# Patient Record
Sex: Female | Born: 1960 | Race: Black or African American | Hispanic: No | Smoking: Never smoker
Health system: Southern US, Community
[De-identification: ages and names within clinical notes are randomized; demographics above are authoritative.]

## PROBLEM LIST (undated history)

## (undated) DIAGNOSIS — E041 Nontoxic single thyroid nodule: Secondary | ICD-10-CM

## (undated) DIAGNOSIS — E785 Hyperlipidemia, unspecified: Secondary | ICD-10-CM

## (undated) DIAGNOSIS — K219 Gastro-esophageal reflux disease without esophagitis: Secondary | ICD-10-CM

## (undated) DIAGNOSIS — R51 Headache: Secondary | ICD-10-CM

## (undated) DIAGNOSIS — D649 Anemia, unspecified: Secondary | ICD-10-CM

## (undated) DIAGNOSIS — I10 Essential (primary) hypertension: Secondary | ICD-10-CM

## (undated) DIAGNOSIS — J309 Allergic rhinitis, unspecified: Secondary | ICD-10-CM

## (undated) DIAGNOSIS — D179 Benign lipomatous neoplasm, unspecified: Secondary | ICD-10-CM

## (undated) HISTORY — DX: Nontoxic single thyroid nodule: E04.1

## (undated) HISTORY — DX: Allergic rhinitis, unspecified: J30.9

## (undated) HISTORY — DX: Gastro-esophageal reflux disease without esophagitis: K21.9

## (undated) HISTORY — DX: Anemia, unspecified: D64.9

## (undated) HISTORY — DX: Benign lipomatous neoplasm, unspecified: D17.9

## (undated) HISTORY — DX: Headache: R51

## (undated) HISTORY — DX: Hyperlipidemia, unspecified: E78.5

## (undated) HISTORY — DX: Essential (primary) hypertension: I10

---

## 2005-11-07 HISTORY — PX: ABDOMINAL HYSTERECTOMY: SHX81

## 2006-01-26 ENCOUNTER — Emergency Department (HOSPITAL_COMMUNITY): Admission: EM | Admit: 2006-01-26 | Discharge: 2006-01-26 | Payer: Self-pay | Admitting: Family Medicine

## 2006-01-26 ENCOUNTER — Ambulatory Visit (HOSPITAL_COMMUNITY): Admission: RE | Admit: 2006-01-26 | Discharge: 2006-01-26 | Payer: Self-pay | Admitting: Family Medicine

## 2006-04-26 ENCOUNTER — Ambulatory Visit: Payer: Self-pay | Admitting: Nurse Practitioner

## 2006-04-26 ENCOUNTER — Ambulatory Visit: Payer: Self-pay | Admitting: *Deleted

## 2006-10-03 ENCOUNTER — Emergency Department (HOSPITAL_COMMUNITY): Admission: EM | Admit: 2006-10-03 | Discharge: 2006-10-03 | Payer: Self-pay | Admitting: Family Medicine

## 2006-12-04 ENCOUNTER — Emergency Department (HOSPITAL_COMMUNITY): Admission: EM | Admit: 2006-12-04 | Discharge: 2006-12-04 | Payer: Self-pay | Admitting: Emergency Medicine

## 2006-12-12 ENCOUNTER — Emergency Department (HOSPITAL_COMMUNITY): Admission: EM | Admit: 2006-12-12 | Discharge: 2006-12-12 | Payer: Self-pay | Admitting: Family Medicine

## 2007-02-28 ENCOUNTER — Inpatient Hospital Stay (HOSPITAL_COMMUNITY): Admission: EM | Admit: 2007-02-28 | Discharge: 2007-03-01 | Payer: Self-pay | Admitting: Emergency Medicine

## 2007-05-16 ENCOUNTER — Ambulatory Visit: Payer: Self-pay | Admitting: Obstetrics & Gynecology

## 2007-05-16 ENCOUNTER — Encounter: Payer: Self-pay | Admitting: Obstetrics & Gynecology

## 2007-05-16 ENCOUNTER — Other Ambulatory Visit: Admission: RE | Admit: 2007-05-16 | Discharge: 2007-05-16 | Payer: Self-pay | Admitting: Obstetrics and Gynecology

## 2007-05-16 ENCOUNTER — Encounter: Payer: Self-pay | Admitting: Obstetrics and Gynecology

## 2007-06-08 ENCOUNTER — Ambulatory Visit: Payer: Self-pay | Admitting: Gynecology

## 2007-06-19 ENCOUNTER — Ambulatory Visit: Payer: Self-pay | Admitting: Family Medicine

## 2007-06-25 ENCOUNTER — Ambulatory Visit: Payer: Self-pay | Admitting: Gynecology

## 2007-06-25 ENCOUNTER — Encounter (INDEPENDENT_AMBULATORY_CARE_PROVIDER_SITE_OTHER): Payer: Self-pay | Admitting: Gynecology

## 2007-06-25 ENCOUNTER — Ambulatory Visit (HOSPITAL_COMMUNITY): Admission: RE | Admit: 2007-06-25 | Discharge: 2007-06-26 | Payer: Self-pay | Admitting: Gynecology

## 2007-06-27 ENCOUNTER — Inpatient Hospital Stay (HOSPITAL_COMMUNITY): Admission: AD | Admit: 2007-06-27 | Discharge: 2007-07-03 | Payer: Self-pay | Admitting: Obstetrics & Gynecology

## 2007-06-29 ENCOUNTER — Ambulatory Visit: Payer: Self-pay | Admitting: Infectious Diseases

## 2007-07-18 ENCOUNTER — Ambulatory Visit: Payer: Self-pay | Admitting: Gynecology

## 2007-07-25 ENCOUNTER — Encounter (INDEPENDENT_AMBULATORY_CARE_PROVIDER_SITE_OTHER): Payer: Self-pay | Admitting: *Deleted

## 2007-08-07 ENCOUNTER — Encounter (INDEPENDENT_AMBULATORY_CARE_PROVIDER_SITE_OTHER): Payer: Self-pay | Admitting: Nurse Practitioner

## 2007-08-07 ENCOUNTER — Ambulatory Visit: Payer: Self-pay | Admitting: Internal Medicine

## 2007-08-07 LAB — CONVERTED CEMR LAB
ALT: 23 units/L (ref 0–35)
AST: 22 units/L (ref 0–37)
Basophils Absolute: 0 10*3/uL (ref 0.0–0.1)
Basophils Relative: 1 % (ref 0–1)
Chloride: 107 meq/L (ref 96–112)
Creatinine, Ser: 0.96 mg/dL (ref 0.40–1.20)
Eosinophils Relative: 4 % (ref 0–5)
Hemoglobin: 12.8 g/dL (ref 12.0–15.0)
MCHC: 33 g/dL (ref 30.0–36.0)
Monocytes Absolute: 0.3 10*3/uL (ref 0.2–0.7)
Neutro Abs: 2 10*3/uL (ref 1.7–7.7)
Platelets: 232 10*3/uL (ref 150–400)
RDW: 16.6 % — ABNORMAL HIGH (ref 11.5–14.0)
TSH: 2.034 microintl units/mL (ref 0.350–5.50)
Total Bilirubin: 0.5 mg/dL (ref 0.3–1.2)
Total CHOL/HDL Ratio: 4
VLDL: 45 mg/dL — ABNORMAL HIGH (ref 0–40)

## 2007-08-10 ENCOUNTER — Ambulatory Visit (HOSPITAL_COMMUNITY): Admission: RE | Admit: 2007-08-10 | Discharge: 2007-08-10 | Payer: Self-pay | Admitting: Nurse Practitioner

## 2007-08-10 ENCOUNTER — Ambulatory Visit (HOSPITAL_COMMUNITY): Admission: RE | Admit: 2007-08-10 | Discharge: 2007-08-10 | Payer: Self-pay | Admitting: Family Medicine

## 2007-09-14 ENCOUNTER — Ambulatory Visit: Payer: Self-pay | Admitting: Family Medicine

## 2007-10-01 ENCOUNTER — Ambulatory Visit: Payer: Self-pay | Admitting: Internal Medicine

## 2007-12-25 ENCOUNTER — Ambulatory Visit: Payer: Self-pay | Admitting: Family Medicine

## 2007-12-25 ENCOUNTER — Encounter (INDEPENDENT_AMBULATORY_CARE_PROVIDER_SITE_OTHER): Payer: Self-pay | Admitting: Nurse Practitioner

## 2007-12-25 LAB — CONVERTED CEMR LAB
Albumin: 4.1 g/dL (ref 3.5–5.2)
Alkaline Phosphatase: 29 units/L — ABNORMAL LOW (ref 39–117)
CO2: 23 meq/L (ref 19–32)
Cholesterol: 170 mg/dL (ref 0–200)
Glucose, Bld: 103 mg/dL — ABNORMAL HIGH (ref 70–99)
LDL Cholesterol: 68 mg/dL (ref 0–99)
Lymphocytes Relative: 37 % (ref 12–46)
Lymphs Abs: 1.5 10*3/uL (ref 0.7–4.0)
Neutrophils Relative %: 52 % (ref 43–77)
Platelets: 228 10*3/uL (ref 150–400)
Potassium: 4 meq/L (ref 3.5–5.3)
Sodium: 142 meq/L (ref 135–145)
Total Protein: 7 g/dL (ref 6.0–8.3)
Triglycerides: 316 mg/dL — ABNORMAL HIGH (ref <150)
WBC: 4 10*3/uL (ref 4.0–10.5)

## 2008-04-17 ENCOUNTER — Ambulatory Visit: Payer: Self-pay | Admitting: Obstetrics & Gynecology

## 2008-04-17 ENCOUNTER — Encounter (INDEPENDENT_AMBULATORY_CARE_PROVIDER_SITE_OTHER): Payer: Self-pay | Admitting: Nurse Practitioner

## 2008-04-17 ENCOUNTER — Ambulatory Visit: Payer: Self-pay | Admitting: Internal Medicine

## 2008-04-17 LAB — CONVERTED CEMR LAB
Cholesterol: 195 mg/dL (ref 0–200)
HDL: 42 mg/dL (ref >39)
LDL Cholesterol: 106 mg/dL — ABNORMAL HIGH (ref 0–99)
Triglycerides: 234 mg/dL — ABNORMAL HIGH (ref <150)

## 2008-04-28 ENCOUNTER — Ambulatory Visit: Payer: Self-pay | Admitting: Nurse Practitioner

## 2008-05-29 ENCOUNTER — Ambulatory Visit: Payer: Self-pay | Admitting: Obstetrics and Gynecology

## 2008-08-14 ENCOUNTER — Ambulatory Visit (HOSPITAL_COMMUNITY): Admission: RE | Admit: 2008-08-14 | Discharge: 2008-08-14 | Payer: Self-pay | Admitting: Family Medicine

## 2008-08-22 ENCOUNTER — Encounter (INDEPENDENT_AMBULATORY_CARE_PROVIDER_SITE_OTHER): Payer: Self-pay | Admitting: Internal Medicine

## 2008-08-22 ENCOUNTER — Ambulatory Visit: Payer: Self-pay | Admitting: Internal Medicine

## 2008-08-22 LAB — CONVERTED CEMR LAB
Barbiturate Quant, Ur: NEGATIVE
Basophils Absolute: 0 10*3/uL (ref 0.0–0.1)
Basophils Relative: 0 % (ref 0–1)
Eosinophils Absolute: 0.1 10*3/uL (ref 0.0–0.7)
Eosinophils Relative: 2 % (ref 0–5)
HCT: 41.4 % (ref 36.0–46.0)
Lymphocytes Relative: 40 % (ref 12–46)
MCHC: 35 g/dL (ref 30.0–36.0)
Marijuana Metabolite: NEGATIVE
Methadone: NEGATIVE
Opiate Screen, Urine: NEGATIVE
Platelets: 258 10*3/uL (ref 150–400)
Propoxyphene: NEGATIVE
RDW: 13.5 % (ref 11.5–15.5)

## 2008-08-25 ENCOUNTER — Ambulatory Visit (HOSPITAL_COMMUNITY): Admission: RE | Admit: 2008-08-25 | Discharge: 2008-08-25 | Payer: Self-pay | Admitting: Internal Medicine

## 2008-10-07 ENCOUNTER — Ambulatory Visit: Payer: Self-pay | Admitting: Family Medicine

## 2008-10-07 ENCOUNTER — Encounter (INDEPENDENT_AMBULATORY_CARE_PROVIDER_SITE_OTHER): Payer: Self-pay | Admitting: Internal Medicine

## 2008-10-07 LAB — CONVERTED CEMR LAB
Alkaline Phosphatase: 28 units/L — ABNORMAL LOW (ref 39–117)
Indirect Bilirubin: 0.5 mg/dL (ref 0.0–0.9)
LDL Cholesterol: 77 mg/dL (ref 0–99)
Total Bilirubin: 0.6 mg/dL (ref 0.3–1.2)
VLDL: 34 mg/dL (ref 0–40)

## 2008-12-11 ENCOUNTER — Ambulatory Visit: Payer: Self-pay | Admitting: Internal Medicine

## 2008-12-22 ENCOUNTER — Ambulatory Visit: Payer: Self-pay | Admitting: Internal Medicine

## 2009-03-06 ENCOUNTER — Ambulatory Visit: Payer: Self-pay | Admitting: Family Medicine

## 2009-03-06 LAB — CONVERTED CEMR LAB
Cholesterol: 170 mg/dL
HDL: 37 mg/dL
LDL Cholesterol: 85 mg/dL

## 2009-04-01 ENCOUNTER — Ambulatory Visit: Payer: Self-pay | Admitting: Family Medicine

## 2009-07-16 ENCOUNTER — Telehealth (INDEPENDENT_AMBULATORY_CARE_PROVIDER_SITE_OTHER): Payer: Self-pay | Admitting: *Deleted

## 2009-07-27 ENCOUNTER — Ambulatory Visit: Payer: Self-pay | Admitting: Family Medicine

## 2009-09-11 ENCOUNTER — Ambulatory Visit: Payer: Self-pay | Admitting: Internal Medicine

## 2009-09-11 ENCOUNTER — Encounter (INDEPENDENT_AMBULATORY_CARE_PROVIDER_SITE_OTHER): Payer: Self-pay | Admitting: Internal Medicine

## 2009-09-11 LAB — CONVERTED CEMR LAB
ALT: 15 units/L (ref 0–35)
Albumin: 4.8 g/dL (ref 3.5–5.2)
BUN: 13 mg/dL (ref 6–23)
CO2: 23 meq/L (ref 19–32)
Calcium: 11 mg/dL — ABNORMAL HIGH (ref 8.4–10.5)
Chloride: 103 meq/L (ref 96–112)
Cholesterol: 209 mg/dL — ABNORMAL HIGH (ref 0–200)
Creatinine, Ser: 0.92 mg/dL (ref 0.40–1.20)
HDL: 45 mg/dL (ref >39)
Potassium: 3.9 meq/L (ref 3.5–5.3)
Total CHOL/HDL Ratio: 4.6

## 2009-09-15 ENCOUNTER — Ambulatory Visit: Payer: Self-pay | Admitting: Internal Medicine

## 2009-09-15 ENCOUNTER — Encounter (INDEPENDENT_AMBULATORY_CARE_PROVIDER_SITE_OTHER): Payer: Self-pay | Admitting: Internal Medicine

## 2009-09-15 LAB — CONVERTED CEMR LAB
GC Probe Amp, Genital: NEGATIVE
TSH: 2.883 microintl units/mL (ref 0.350–4.500)
Vit D, 25-Hydroxy: 25 ng/mL — ABNORMAL LOW (ref 30–89)

## 2009-09-18 ENCOUNTER — Ambulatory Visit (HOSPITAL_COMMUNITY): Admission: RE | Admit: 2009-09-18 | Discharge: 2009-09-18 | Payer: Self-pay | Admitting: Family Medicine

## 2009-09-24 ENCOUNTER — Ambulatory Visit (HOSPITAL_COMMUNITY): Admission: RE | Admit: 2009-09-24 | Discharge: 2009-09-24 | Payer: Self-pay | Admitting: Internal Medicine

## 2009-09-28 ENCOUNTER — Encounter: Admission: RE | Admit: 2009-09-28 | Discharge: 2009-09-28 | Payer: Self-pay | Admitting: Family Medicine

## 2009-10-13 ENCOUNTER — Telehealth (INDEPENDENT_AMBULATORY_CARE_PROVIDER_SITE_OTHER): Payer: Self-pay | Admitting: *Deleted

## 2009-10-16 ENCOUNTER — Ambulatory Visit: Payer: Self-pay | Admitting: Internal Medicine

## 2009-10-28 ENCOUNTER — Ambulatory Visit (HOSPITAL_COMMUNITY): Admission: RE | Admit: 2009-10-28 | Discharge: 2009-10-28 | Payer: Self-pay | Admitting: Internal Medicine

## 2010-01-15 ENCOUNTER — Ambulatory Visit: Payer: Self-pay | Admitting: Family Medicine

## 2010-02-18 ENCOUNTER — Emergency Department (HOSPITAL_COMMUNITY): Admission: EM | Admit: 2010-02-18 | Discharge: 2010-02-18 | Payer: Self-pay | Admitting: Emergency Medicine

## 2010-04-15 ENCOUNTER — Inpatient Hospital Stay (HOSPITAL_COMMUNITY): Admission: AD | Admit: 2010-04-15 | Discharge: 2010-04-15 | Payer: Self-pay | Admitting: Obstetrics & Gynecology

## 2010-05-12 ENCOUNTER — Ambulatory Visit: Payer: Self-pay | Admitting: Obstetrics and Gynecology

## 2010-05-12 LAB — CONVERTED CEMR LAB
HCV Ab: NEGATIVE
Hepatitis B Surface Ag: NEGATIVE
Yeast Wet Prep HPF POC: NONE SEEN

## 2010-05-13 ENCOUNTER — Encounter: Payer: Self-pay | Admitting: Obstetrics and Gynecology

## 2010-05-13 LAB — CONVERTED CEMR LAB: Chlamydia, DNA Probe: NEGATIVE

## 2010-07-12 ENCOUNTER — Emergency Department (HOSPITAL_COMMUNITY): Admission: EM | Admit: 2010-07-12 | Discharge: 2010-07-12 | Payer: Self-pay | Admitting: Emergency Medicine

## 2010-09-11 ENCOUNTER — Encounter: Payer: Self-pay | Admitting: Internal Medicine

## 2010-09-15 ENCOUNTER — Encounter: Payer: Self-pay | Admitting: Internal Medicine

## 2010-09-21 ENCOUNTER — Ambulatory Visit: Payer: Self-pay | Admitting: Internal Medicine

## 2010-09-21 ENCOUNTER — Encounter: Payer: Self-pay | Admitting: Internal Medicine

## 2010-09-21 DIAGNOSIS — K219 Gastro-esophageal reflux disease without esophagitis: Secondary | ICD-10-CM | POA: Insufficient documentation

## 2010-09-21 DIAGNOSIS — I10 Essential (primary) hypertension: Secondary | ICD-10-CM | POA: Insufficient documentation

## 2010-09-21 DIAGNOSIS — E785 Hyperlipidemia, unspecified: Secondary | ICD-10-CM | POA: Insufficient documentation

## 2010-09-21 DIAGNOSIS — J309 Allergic rhinitis, unspecified: Secondary | ICD-10-CM | POA: Insufficient documentation

## 2010-09-21 DIAGNOSIS — D649 Anemia, unspecified: Secondary | ICD-10-CM | POA: Insufficient documentation

## 2010-09-21 DIAGNOSIS — M545 Low back pain: Secondary | ICD-10-CM

## 2010-09-21 LAB — CONVERTED CEMR LAB: HIV: NONREACTIVE

## 2010-09-22 DIAGNOSIS — D179 Benign lipomatous neoplasm, unspecified: Secondary | ICD-10-CM | POA: Insufficient documentation

## 2010-09-22 LAB — CONVERTED CEMR LAB
AST: 18 units/L (ref 0–37)
Albumin: 4.3 g/dL (ref 3.5–5.2)
Alkaline Phosphatase: 33 units/L — ABNORMAL LOW (ref 39–117)
Basophils Relative: 0.4 % (ref 0.0–3.0)
Bilirubin Urine: NEGATIVE
Bilirubin, Direct: 0.1 mg/dL (ref 0.0–0.3)
CO2: 25 meq/L (ref 19–32)
Calcium: 9.5 mg/dL (ref 8.4–10.5)
Cholesterol: 202 mg/dL — ABNORMAL HIGH (ref 0–200)
Creatinine, Ser: 1 mg/dL (ref 0.4–1.2)
Eosinophils Absolute: 0.2 10*3/uL (ref 0.0–0.7)
GFR calc non Af Amer: 66.06 mL/min (ref >60)
HDL: 36.5 mg/dL — ABNORMAL LOW (ref >39.00)
Hemoglobin: 13.1 g/dL (ref 12.0–15.0)
Leukocytes, UA: NEGATIVE
Lymphocytes Relative: 35.6 % (ref 12.0–46.0)
MCHC: 34.7 g/dL (ref 30.0–36.0)
Monocytes Relative: 3.9 % (ref 3.0–12.0)
Neutro Abs: 2.4 10*3/uL (ref 1.4–7.7)
Neutrophils Relative %: 55.7 % (ref 43.0–77.0)
Nitrite: NEGATIVE
RBC: 4.61 M/uL (ref 3.87–5.11)
Sodium: 135 meq/L (ref 135–145)
TSH: 2.28 microintl units/mL (ref 0.35–5.50)
Total CHOL/HDL Ratio: 6
Triglycerides: 150 mg/dL — ABNORMAL HIGH (ref 0.0–149.0)
Urobilinogen, UA: 0.2 (ref 0.0–1.0)
VLDL: 30 mg/dL (ref 0.0–40.0)
WBC: 4.2 10*3/uL — ABNORMAL LOW (ref 4.5–10.5)
pH: 6 (ref 5.0–8.0)

## 2010-10-08 ENCOUNTER — Telehealth: Payer: Self-pay | Admitting: Internal Medicine

## 2010-10-12 ENCOUNTER — Encounter (INDEPENDENT_AMBULATORY_CARE_PROVIDER_SITE_OTHER): Payer: Self-pay | Admitting: *Deleted

## 2010-10-19 ENCOUNTER — Encounter
Admission: RE | Admit: 2010-10-19 | Discharge: 2010-10-19 | Payer: Self-pay | Source: Home / Self Care | Attending: Internal Medicine | Admitting: Internal Medicine

## 2010-10-19 ENCOUNTER — Encounter: Payer: Self-pay | Admitting: Internal Medicine

## 2010-11-04 ENCOUNTER — Emergency Department (HOSPITAL_COMMUNITY)
Admission: EM | Admit: 2010-11-04 | Discharge: 2010-11-04 | Payer: Self-pay | Source: Home / Self Care | Admitting: Emergency Medicine

## 2010-11-28 ENCOUNTER — Encounter: Payer: Self-pay | Admitting: Internal Medicine

## 2010-12-07 NOTE — Progress Notes (Signed)
Summary: [name]  Phone Note Call from Patient Call back at [facility] Phone [phone]   Caller: Mom Call For: Jamie Lukes MD Summary of Call: Pt needs prescription for Ambien, pt cant sleep. [facility]. Initial call taken by: Verdell Face,  October 08, 2010 12:15 PM  Follow-up for Phone Call        ok to call or fax to her pharm - zolpidem 10mg  as listed below - thanks Follow-up by: Jamie Lukes MD,  October 08, 2010 1:10 PM  Additional Follow-up for Phone Call Additional follow up Details #1::        Notified pt rx sent to pharmacy Additional Follow-up by: Orlan Leavens RMA,  October 08, 2010 2:28 PM    New/Updated Medications: ZOLPIDEM TARTRATE 10 MG TABS (ZOLPIDEM TARTRATE) 1/2-1 by mouth at bedtime as needed for sleep Prescriptions: ZOLPIDEM TARTRATE 10 MG TABS (ZOLPIDEM TARTRATE) 1/2-1 by mouth at bedtime as needed for sleep  #20 x 2   Entered by:   Orlan Leavens RMA   Authorized by:   Jamie Lukes MD   Signed by:   Orlan Leavens RMA on 10/08/2010   Method used:   Printed then faxed to ...       [facility]  [address] [phone][facility]       [phone] [address] [place]  [address]       Ph: [phone] or [phone]       Fax: [phone]   RxID:   [id number] ZOLPIDEM TARTRATE 10 MG TABS (ZOLPIDEM TARTRATE) 1/2-1 by mouth at bedtime as needed for sleep  #20 x 2   Entered and Authorized by:   Jamie Lukes MD   Signed by:   Jamie Lukes MD on 10/08/2010   Method used:   Historical   RxID:   [id number]

## 2010-12-07 NOTE — Letter (Signed)
Summary: [facility] Consult Scheduled Letter  [facility]  [address] [place], [place] [address]   Phone: [phone]  Fax: [phone]      10/12/2010 MRN: [id number]  [name] [address] [place], [place]  [address]    Dear Ms. BOONE,      We have scheduled an appointment for you.  At the recommendation of Dr.Leschber, we have scheduled you a consult with Dr Corliss Skains on 10/19/10 at 9:30am.  Their phone number is [phone].  If this appointment day and time is not convenient for you, please feel free to call the office of the doctor you are being referred to at the number listed above and reschedule the appointment.     [facility] [address] [place], [place] [address]     Thank you,   Patient Care Coordinator [facility]

## 2010-12-07 NOTE — Assessment & Plan Note (Signed)
Summary: New CPX/Bcbs/#/cd   Vital Signs:  Patient profile:   50 year old female Height:      68 inches (172.72 cm) Weight:      172.12 pounds (78.24 kg) BMI:     26.27 O2 Sat:      96 % on Room air Temp:     98.5 degrees F (36.94 degrees C) oral Pulse rate:   77 / minute BP sitting:   120 / 82  (left arm) Cuff size:   regular  Vitals Entered By: Orlan Leavens RMA (September 21, 2010 9:53 AM)  O2 Flow:  Room air CC: New patient CPX Is Patient Diabetic? No Pain Assessment Patient in pain? no        Primary Care Alishah Schulte:  Newt Lukes MD  CC:  New patient CPX.  History of Present Illness: new pt to me and our practice, here to est care also patient is here today for annual physical. Patient feels well.  denies high risk behavior but insists on HIV and STD screening as this has always been done for her  also has other concerns to review: c/o low back pain onset 3 months ago, worse in past 1 week exac by picking up g-kids pain worse with any twisting movement no radiation of pain into legs or abd not using any OTC meds for same no assoc fever or weakness - no dysuria or hematura -  hypertriglyceridemia - never on rx for same - brought records from prior Sherlie Boyum to review - tries to follow low fat diet and exercise  htn - prev on meds but non in past 2 years - no CP, edema -  c/o daily headache in past 2 weeks  GERD - prev on nexium and zantac - off x 3 months - daily burn symptoms -  no abd pain, n or v; no hx PUD or BRBPR not using OTC meds due to ineffective relief of symptoms on same - would like new rx now -   also has growth on posterior right shoulder that she would like removed -  describes as painful to pressure but not affecting ROM   Preventive Screening-Counseling & Management  Alcohol-Tobacco     Alcohol drinks/day: <1     Alcohol Counseling: not indicated; use of alcohol is not excessive or problematic     Smoking Status: never     Tobacco  Counseling: not indicated; no tobacco use  Caffeine-Diet-Exercise     Does Patient Exercise: no     Exercise Counseling: to improve exercise regimen     Depression Counseling: not indicated; screening negative for depression  Safety-Violence-Falls     Seat Belt Counseling: not indicated; patient wears seat belts     Helmet Counseling: not indicated; patient wears helmet when riding bicycle/motocycle     Firearm Counseling: not applicable     Violence Counseling: not indicated; no violence risk noted     Fall Risk Counseling: not indicated; no significant falls noted  Clinical Review Panels:  Lipid Management   Cholesterol:  209 (09/11/2009)   LDL (bad choesterol):  141 (09/11/2009)   HDL (good cholesterol):  45 (09/11/2009)   Triglycerides:  241 (03/06/2009)  CBC   WBC:  4.7 (08/22/2008)   RBC:  5.30 (08/22/2008)   Hgb:  14.5 (08/22/2008)   Hct:  41.4 (08/22/2008)   Platelets:  258 (08/22/2008)   MCV  78.1 (08/22/2008)   MCHC  35.0 (08/22/2008)   RDW  13.5 (08/22/2008)  PMN:  50 (08/22/2008)   Lymphs:  40 (08/22/2008)   Monos:  7 (08/22/2008)   Eosinophils:  2 (08/22/2008)   Basophil:  0 (08/22/2008)  Complete Metabolic Panel   Glucose:  88 (09/11/2009)   Sodium:  138 (09/11/2009)   Potassium:  3.9 (09/11/2009)   Chloride:  103 (09/11/2009)   CO2:  23 (09/11/2009)   BUN:  13 (09/11/2009)   Creatinine:  0.92 (09/11/2009)   Albumin:  4.8 (09/11/2009)   Total Protein:  7.5 (09/11/2009)   Calcium:  11.0 (09/11/2009)   Total Bili:  0.3 (09/11/2009)   Alk Phos:  38 (09/11/2009)   SGPT (ALT):  15 (09/11/2009)   SGOT (AST):  13 (09/11/2009)   -  Date:  03/06/2009    Triglycerides: 241  Current Medications (verified): 1)  None  Allergies (verified): No Known Drug Allergies  Past History:  Past Medical History: Allergic rhinitis Anemia-NOS GERD Hyperlipidemia - triglycerides Hypertension  Past Surgical History: Hysterectomy (2009)  Family  History: Family History of Arthritis (grandparent) Family History Breast cancer 1st degree relative <50 (other relative) Family History Diabetes 1st degree relative (grandparent) Family History High cholesterol (grandparent) Family History Hypertension (parent, grandparent)  Social History: Never Smoked, social alcohol use employed - Museum/gallery conservator night shift divorced, lives with exhusband at this time, has boyfriend Smoking Status:  never Does Patient Exercise:  no  Review of Systems       see HPI above. I have reviewed all other systems and they were negative.   Physical Exam  General:  alert, well-developed, well-nourished, and cooperative to examination.    Head:  Normocephalic and atraumatic without obvious abnormalities. No apparent alopecia or balding. Eyes:  vision grossly intact; pupils equal, round and reactive to light.  conjunctiva and lids normal.    Ears:  R ear normal and L ear normal.   Mouth:  teeth and gums in good repair; mucous membranes moist, without lesions or ulcers. oropharynx clear without exudate, no erythema.  Neck:  supple, full ROM, no masses, no thyromegaly; no thyroid nodules or tenderness. no JVD or carotid bruits.   Lungs:  normal respiratory effort, no intercostal retractions or use of accessory muscles; normal breath sounds bilaterally - no crackles and no wheezes.    Heart:  normal rate, regular rhythm, no murmur, and no rub. BLE without edema. normal DP pulses and normal cap refill in all 4 extremities    Abdomen:  soft, non-tender, normal bowel sounds, no distention; no masses and no appreciable hepatomegaly or splenomegaly.   Genitalia:  defer to gyn Msk:  No deformity or scoliosis noted of thoracic or lumbar spine.  large lipoma over posterior right scapular region but FROM; back: full range of motion of lumbar spine. mod tender to palpation. Deep tendon reflexes symmetrically intact. Sensation intact throughout all dermatomes in bilateral  lower extremities. Full strength to manual muscle testing in all major muscle groups. Able to heel and toe walk without difficulty and ambulates with a normal gait.  Neurologic:  alert & oriented X3 and cranial nerves II-XII symetrically intact.  strength normal in all extremities, sensation intact to light touch, and gait normal. speech fluent without dysarthria or aphasia; follows commands with good comprehension.  Skin:  2.5" round large lipoma over right scapular region Psych:  Oriented X3, memory intact for recent and remote, normally interactive, good eye contact, not anxious appearing, not depressed appearing, and not agitated.      Impression & Recommendations:  Problem #  1:  PREVENTIVE HEALTH CARE (ICD-V70.0) Patient has been counseled on age-appropriate routine health concerns for screening and prevention. These are reviewed and up-to-date. Immunizations are up-to-date or declined. Labs ordered and ECG reviewed.  Orders: EKG w/ Interpretation (93000) TLB-Lipid Panel (80061-LIPID) TLB-BMP (Basic Metabolic Panel-BMET) (80048-METABOL) TLB-CBC Platelet - w/Differential (85025-CBCD) TLB-Hepatic/Liver Function Pnl (80076-HEPATIC) TLB-TSH (Thyroid Stimulating Hormone) (84443-TSH) TLB-Udip w/ Micro (81001-URINE) Misc. Referral (Misc. Ref)  Problem # 2:  SCREENING EXAMINATION FOR VENEREAL DISEASE (ICD-V74.5) per pt request Orders: T-GC Probe, urine [phone]) T-Chlamydia  Probe, urine ([id number]) T-HIV Antibody  (Reflex) ([id number])  Problem # 3:  BACK PAIN, LUMBAR (ICD-724.2) exam c/w strain, no neuro deficts - tx mescle relax and NSAIDs - erx done also tordol today at pt request for "shot" Her updated medication list for this problem includes:    Robaxin 500 Mg Tabs (Methocarbamol) .Marland Kitchen... 1 by mouth three times a day for muscle spasm    Diclofenac Sodium 50 Mg Tbec (Diclofenac sodium) .Marland Kitchen... 1 by mouth two times a day as needed for pain  Orders: Ketorolac-Toradol 15mg   (A3557) Admin of Therapeutic Inj  intramuscular or subcutaneous (32202) Prescription Created Electronically [phone])  Problem # 4:  GERD (ICD-530.81)  start PPI - hx same, no red flags on hx or exam Her updated medication list for this problem includes:    Omeprazole 20 Mg Cpdr (Omeprazole) .Marland Kitchen... 1 by mouth once daily  Labs Reviewed: Hgb: 14.5 (08/22/2008)   Hct: 41.4 (08/22/2008)  Orders: Prescription Created Electronically ([phone])  Problem # 5:  HYPERLIPIDEMIA (ICD-272.4)  high TGs on prior labs - check today and consider tx as needed   Labs Reviewed: SGOT: 13 (09/11/2009)   SGPT: 15 (09/11/2009)   HDL:45 (09/11/2009), 37 (03/06/2009)  LDL:141 (09/11/2009), 85 (28/31/5176)  Chol:209 (09/11/2009), 170 (03/06/2009)  Trig:116 (09/11/2009), 241 (03/06/2009)  Problem # 6:  HYPERTENSION (ICD-401.9) off meds since 2009 - no need for rx based on BP today - watch future OV checks BP today: 120/82  Labs Reviewed: K+: 3.9 (09/11/2009) Creat: : 0.92 (09/11/2009)   Chol: 209 (09/11/2009)   HDL: 45 (09/11/2009)   LDL: 141 (09/11/2009)   TG: 116 (09/11/2009)  Problem # 7:  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 1 by mouth once daily 2)  Robaxin 500 Mg Tabs (Methocarbamol) .Marland Kitchen.. 1 by mouth three times a day for muscle spasm 3)  Diclofenac Sodium 50 Mg Tbec (Diclofenac sodium) .Marland Kitchen.. 1 by mouth two times a day as needed for pain 4)  Claritin 10 Mg Tabs (Loratadine) .Marland Kitchen.. 1 by mouth once daily  Patient Instructions: 1)  it was good to see you today. 2)  test(s) ordered today - your results will be posted on the phone tree for review in 48-72 hours from the time of test completion; call [phone] and enter your 9 digit MRN (listed above on this page, just below your name); if any changes need to be made or there are  abnormal results, you will be contacted directly. 3)  tordol shot given for back pain today 4)  also use diclofenac and robaxin for back pain as discussed 5)  start omeprazole for reflux and claritin for allergy symptoms  6)  your prescriptions have been electronically submitted to your pharmacy. Please take as directed. Contact our office if you believe you're having problems  with the medication(s).  7)  we'll make referral for mammogram. Our office will contact you regarding this appointment once made.  8)  we'll make referral to gen surg for your back growth and lipoma Our office will contact you regarding this appointment once made.  9)  Please schedule a follow-up appointment in 3 months to review blood pressure, allergy symptoms and headaches; call sooner if problems.  Prescriptions: CLARITIN 10 MG TABS (LORATADINE) 1 by mouth once daily  #30 x 3   Entered and Authorized by:   Newt Lukes MD   Signed by:   Newt Lukes MD on 09/21/2010   Method used:   [facility]        [facility] [phone]* (retail)       [address] [place]  [address]       Ph: [phone] or [phone]       Fax: [phone]   RxID:   [id number] DICLOFENAC SODIUM 50 MG TBEC (DICLOFENAC SODIUM) 1 by mouth two times a day as needed for pain  #30 x 0   Entered and Authorized by:   Newt Lukes MD   Signed by:   Newt Lukes MD on 09/21/2010   Method used:   [facility]        [facility] [phone]* (retail)       [address], [place]  [address]       Ph: [phone] or [phone]       Fax: [phone]   RxID:   [id number] ROBAXIN 500 MG TABS (METHOCARBAMOL) 1 by mouth three times a day for muscle spasm  #30 x 1   Entered and Authorized by:   Newt Lukes MD   Signed by:   Newt Lukes MD on 09/21/2010   Method used:   [facility]        [facility] [phone]* (retail)       [address], [place]  [address]       Ph: [phone] or [phone]       Fax: [phone]   RxID:   [id number] OMEPRAZOLE 20 MG CPDR (OMEPRAZOLE) 1 by mouth once daily  #30 x 6   Entered and Authorized by:   Newt Lukes MD   Signed by:   Newt Lukes MD on 09/21/2010   Method used:   [facility]        [facility] [phone]* (retail)       [address], [place]  [address]       Ph: [phone] or [phone]       Fax: [phone]   RxID:   [id number]    Medication Administration  Injection # 1:    Medication: Ketorolac-Toradol 15mg     Diagnosis: BACK PAIN, LUMBAR (ICD-724.2)    Route: IM    Site: LUOQ gluteus    Exp Date: 12/08/2010    Lot #: [id number]    Mfr: NOVAPLUS    Comments: Gave total of 60mg     Patient tolerated injection without complications    Given by: Orlan Leavens RMA (September 21, 2010 10:46 AM)  Orders Added: 1)  EKG w/ Interpretation [93000] 2)  TLB-Lipid Panel [80061-LIPID] 3)  TLB-BMP (Basic Metabolic  Panel-BMET) [80048-METABOL] 4)  TLB-CBC Platelet - w/Differential [85025-CBCD] 5)  TLB-Hepatic/Liver Function Pnl [80076-HEPATIC] 6)  TLB-TSH (Thyroid Stimulating Hormone) [84443-TSH] 7)  TLB-Udip w/ Micro [81001-URINE] 8)  New Patient 40-64 years [99386] 9)  T-GC Probe, urine [phone] 10)  T-Chlamydia  Probe, urine [phone] 11)  T-HIV Antibody  (Reflex) [[id number]] 12)  Ketorolac-Toradol 15mg  [J1885] 13)  Admin of Therapeutic Inj  intramuscular or subcutaneous [96372] 14)  New Patient Level III [96295] 15)  Prescription Created Electronically [G8553] 16)  Surgical Referral [Surgery] 17)  Misc. Referral [Misc. Ref]   Immunization History:  Influenza Immunization History:    Influenza:  historical given @ [facility] (09/15/2009)  Tetanus/Td Immunization History:    Tetanus/Td:  historical  (11/07/2002)   Immunization History:  Influenza Immunization History:    Influenza:  Historical given @  [facility] (09/15/2009)  Tetanus/Td Immunization History:    Tetanus/Td:  Historical  (11/07/2002)

## 2010-12-08 HISTORY — PX: OTHER SURGICAL HISTORY: SHX169

## 2010-12-09 NOTE — Consult Note (Signed)
Summary: [facility]   Imported By: Sherian Rein 11/03/2010 08:00:25  _____________________________________________________________________  External Attachment:    Type:   Image     Comment:   External Document

## 2010-12-22 ENCOUNTER — Ambulatory Visit (INDEPENDENT_AMBULATORY_CARE_PROVIDER_SITE_OTHER): Payer: BC Managed Care – PPO | Admitting: Internal Medicine

## 2010-12-22 ENCOUNTER — Encounter: Payer: Self-pay | Admitting: Internal Medicine

## 2010-12-22 DIAGNOSIS — J019 Acute sinusitis, unspecified: Secondary | ICD-10-CM

## 2010-12-22 DIAGNOSIS — R519 Headache, unspecified: Secondary | ICD-10-CM | POA: Insufficient documentation

## 2010-12-22 DIAGNOSIS — J309 Allergic rhinitis, unspecified: Secondary | ICD-10-CM

## 2010-12-22 DIAGNOSIS — R51 Headache: Secondary | ICD-10-CM | POA: Insufficient documentation

## 2010-12-23 ENCOUNTER — Emergency Department (HOSPITAL_COMMUNITY)
Admission: EM | Admit: 2010-12-23 | Discharge: 2010-12-23 | Disposition: A | Discharge status: Home / Self Care | Payer: BC Managed Care – PPO | Attending: Emergency Medicine | Admitting: Emergency Medicine

## 2010-12-23 ENCOUNTER — Telehealth: Payer: Self-pay | Admitting: Internal Medicine

## 2010-12-23 DIAGNOSIS — L509 Urticaria, unspecified: Secondary | ICD-10-CM | POA: Insufficient documentation

## 2010-12-23 DIAGNOSIS — Z888 Allergy status to other drugs, medicaments and biological substances status: Secondary | ICD-10-CM | POA: Insufficient documentation

## 2010-12-23 DIAGNOSIS — T360X5A Adverse effect of penicillins, initial encounter: Secondary | ICD-10-CM | POA: Insufficient documentation

## 2010-12-23 DIAGNOSIS — Z880 Allergy status to penicillin: Secondary | ICD-10-CM | POA: Insufficient documentation

## 2010-12-29 ENCOUNTER — Ambulatory Visit: Payer: BC Managed Care – PPO | Admitting: Internal Medicine

## 2010-12-29 NOTE — Assessment & Plan Note (Signed)
Summary: 3 MTH FU STC   Vital Signs:  Patient profile:   50 year old female Height:      68 inches (172.72 cm) Weight:      175 pounds (79.55 kg) O2 Sat:      99 % on Room air Temp:     98.6 degrees F (37.00 degrees C) oral Pulse rate:   90 / minute BP sitting:   110 / 88  (left arm) Cuff size:   large  Vitals Entered By: Orlan Leavens RMA (December 22, 2010 8:34 AM)  O2 Flow:  Room air CC: 3 month follow-up Is Patient Diabetic? No Pain Assessment Patient in pain? no        Primary Care Provider:  Newt Lukes MD  CC:  3 month follow-up.  History of Present Illness: here for f/u  allg rhinitis - not using claritin due to cost - cont nasal drainage, sneezing and sinus pressure - thick yellow nasal discharge in past 2 weeks with LGF and upper jaw discomfort - complicates usual HA symptoms   continued HA symptoms - daily - ongoing >6 months not improved with OTC meds - no fever, no change in HA location or intenstiy located over L temporal region, then spread to right no weakness, no vision change - no head trauma or injury, falls  hypertriglyceridemia - never on rx for same - brought records from prior provider to review - tries to follow low fat diet and exercise  htn - prev on meds but none >2 years - no CP, edema -   GERD - prev on nexium and zantac - off x 3 months - daily burn symptoms -  no abd pain, n or v; no hx PUD or BRBPR not using OTC meds due to ineffective relief of symptoms on same -   Clinical Review Panels:  Prevention   Last Mammogram:  ASSESSMENT: Negative - BI-RADS 1^MM DIGITAL SCREENING (10/19/2010)  Immunizations   Last Tetanus Booster:  Historical  (11/07/2002)   Last Flu Vaccine:  Historical given @ [facility] (09/15/2009)  Lipid Management   Cholesterol:  202 (09/21/2010)   LDL (bad choesterol):  141 (09/11/2009)   HDL (good cholesterol):  36.50 (09/21/2010)   Triglycerides:  241 (03/06/2009)  CBC   WBC:  4.2 (09/21/2010)  RBC:  4.61 (09/21/2010)   Hgb:  13.1 (09/21/2010)   Hct:  37.7 (09/21/2010)   Platelets:  248.0 (09/21/2010)   MCV  81.8 (09/21/2010)   MCHC  34.7 (09/21/2010)   RDW  13.0 (09/21/2010)   PMN:  55.7 (09/21/2010)   Lymphs:  35.6 (09/21/2010)   Monos:  3.9 (09/21/2010)   Eosinophils:  4.4 (09/21/2010)   Basophil:  0.4 (09/21/2010)  Complete Metabolic Panel   Glucose:  79 (09/21/2010)   Sodium:  135 (09/21/2010)   Potassium:  4.4 (09/21/2010)   Chloride:  102 (09/21/2010)   CO2:  25 (09/21/2010)   BUN:  16 (09/21/2010)   Creatinine:  1.0 (09/21/2010)   Albumin:  4.3 (09/21/2010)   Total Protein:  7.4 (09/21/2010)   Calcium:  9.5 (09/21/2010)   Total Bili:  0.6 (09/21/2010)   Alk Phos:  33 (09/21/2010)   SGPT (ALT):  19 (09/21/2010)   SGOT (AST):  18 (09/21/2010)   Current Medications (verified): 1)  Omeprazole 20 Mg Cpdr (Omeprazole) .Marland Kitchen.. 1 By Mouth Once Daily 2)  Robaxin 500 Mg Tabs (Methocarbamol) .Marland Kitchen.. 1 By Mouth Three Times A Day For Muscle Spasm As Needed 3)  Diclofenac  Sodium 50 Mg Tbec (Diclofenac Sodium) .Marland Kitchen.. 1 By Mouth Two Times A Day As Needed For Pain 4)  Claritin 10 Mg Tabs (Loratadine) .Marland Kitchen.. 1 By Mouth Once Daily As Needed 5)  Zolpidem Tartrate 10 Mg Tabs (Zolpidem Tartrate) .... 1/2-1 By Mouth At Bedtime As Needed For Sleep  Allergies (verified): No Known Drug Allergies  Past History:  Past Medical History: Allergic rhinitis Anemia-NOS GERD Hyperlipidemia - triglycerides  Hypertension hx  Past Surgical History: Hysterectomy (2007) R shoulder lipoma excision - planned 12/2010 - [facility]  Review of Systems  The patient denies weight loss, vision loss, chest pain, hemoptysis, suspicious skin lesions, and difficulty walking.    Physical Exam  General:  alert, well-developed, well-nourished, and cooperative to examination.    Eyes:  vision grossly intact; pupils equal, round and reactive to light.  conjunctiva and lids normal.    Ears:  R ear normal  and L ear normal.   Nose:  nasal dischargemucosal pallor, L frontal sinus tenderness, and L maxillary sinus tenderness.   Mouth:  teeth and gums in good repair; mucous membranes moist, without lesions or ulcers. oropharynx clear without exudate, no erythema.  Lungs:  normal respiratory effort, no intercostal retractions or use of accessory muscles; normal breath sounds bilaterally - no crackles and no wheezes.    Heart:  normal rate, regular rhythm, no murmur, and no rub. BLE without edema. normal DP pulses and normal cap refill in all 4 extremities    Neurologic:  alert & oriented X3 and cranial nerves II-XII symetrically intact.  strength normal in all extremities, sensation intact to light touch, and gait normal. speech fluent without dysarthria or aphasia; follows commands with good comprehension.    Impression & Recommendations:  Problem # 1:  ACUTE SINUSITIS, UNSPECIFIED (ICD-461.9)  Her updated medication list for this problem includes:    Augmentin 875-125 Mg Tabs (Amoxicillin-pot clavulanate) .Marland Kitchen... 1 by mouth two times a day x 7 days  Instructed on treatment. Call if symptoms persist or worsen.   Orders: Prescription Created Electronically [id number])  Problem # 2:  ALLERGIC RHINITIS (ICD-477.9)  rec generic claritin to help with sinus and HA symptoms  Her updated medication list for this problem includes:    Loratadine 10 Mg Tabs (Loratadine) .Marland Kitchen... 1 by mouth once daily  Discussed use of allergy medications and environmental measures.   Problem # 3:  HEADACHE (ICD-784.0)  nonfocal exam, daily symptoms unresponsive to OTC tx and NSAIDs - refer to neuro at pt pref  Her updated medication list for this problem includes:    Diclofenac Sodium 50 Mg Tbec (Diclofenac sodium) .Marland Kitchen... 1 by mouth two times a day as needed for pain  Orders: Neurology Referral (Neuro)  Complete Medication List: 1)  Omeprazole 20 Mg Cpdr (Omeprazole) .Marland Kitchen.. 1 by mouth once daily 2)  Diclofenac Sodium 50  Mg Tbec (Diclofenac sodium) .Marland Kitchen.. 1 by mouth two times a day as needed for pain 3)  Zolpidem Tartrate 10 Mg Tabs (Zolpidem tartrate) .... 1/2-1 by mouth at bedtime as needed for sleep 4)  Loratadine 10 Mg Tabs (Loratadine) .Marland Kitchen.. 1 by mouth once daily 5)  Zanaflex 4 Mg Caps (Tizanidine hcl) .Marland Kitchen.. 1 by mouth every 8 hours as needed for muscle spasm pain 6)  Augmentin 875-125 Mg Tabs (Amoxicillin-pot clavulanate) .Marland Kitchen.. 1 by mouth two times a day x 7 days  Patient Instructions: 1)  it was good to see you today. 2)  use Augmentin antibioitcs and generic claritin for your allergy and  sinus symptoms  3)  change robaxin to zanaflex for muscle relaxant 4)  refill on generic ambein and diclofenac 5)  your prescriptions have been electronically submitted or faxed to your pharmacy. Please take as directed. Contact our office if you believe you're having problems with the medication(s).  6)  we'll make referral to neurology for your headaches. Our office will contact you regarding this appointment once made.  7)  ask [facility] to include [place] in correspodence about your lipoma removal plans 8)  Please schedule a follow-up appointment in 3 months to review blood pressure, allergy symptoms and headaches; call sooner if problems. will check your cholesterol every 6-12 months Prescriptions: ZOLPIDEM TARTRATE 10 MG TABS (ZOLPIDEM TARTRATE) 1/2-1 by mouth at bedtime as needed for sleep  #20 x 2   Entered and Authorized by:   Newt Lukes MD   Signed by:   Newt Lukes MD on 12/22/2010   Method used:   Printed then faxed to ...       [facility] [place] [phone]* (retail)       [phone] [place]  [address]       Ph: [phone] or [phone]       Fax: [phone]   RxID:   [id number] DICLOFENAC SODIUM 50 MG TBEC (DICLOFENAC SODIUM) 1 by mouth two times a day as needed for pain  #30 x 1   Entered and Authorized by:   Newt Lukes MD   Signed by:   Newt Lukes MD on  12/22/2010   Method used:   [facility] to        [facility] [phone]* (retail)       [address], [place]  [address]       Ph: [phone] or [phone]       Fax: [phone]   RxID:   [id number] AUGMENTIN 875-125 MG TABS (AMOXICILLIN-POT CLAVULANATE) 1 by mouth two times a day x 7 days  #14 x 0   Entered and Authorized by:   Newt Lukes MD   Signed by:   Newt Lukes MD on 12/22/2010   Method used:   [facility]        [facility] [phone]* (retail)       [address] [place]  [address]       Ph: [phone] or [phone]       Fax: [phone]   RxID:   [id number] ZANAFLEX 4 MG CAPS (TIZANIDINE HCL) 1 by mouth every 8 hours as needed for muscle spasm pain  #30 x 1   Entered and Authorized by:   Newt Lukes MD   Signed by:   Newt Lukes MD on 12/22/2010   Method used:   [facility] to        [facility] [phone]* (retail)       [address], [place]  [address]       Ph: [phone] or [phone]       Fax: [phone]   RxID:   [id number]    Orders Added: 1)  Est. Patient Level IV [81856] 2)  Prescription Created Electronically [[id number]] 3)  Neurology Referral [Neuro]

## 2010-12-29 NOTE — Progress Notes (Signed)
Summary: Rx req  Phone Note Call from Patient Call back at Home Phone [phone]   Caller: Patient Summary of Call: Pt called requesting Rx for Epi pen and ABX.  Initial call taken by: Margaret Pyle, CMA,  December 23, 2010 11:07 AM  Follow-up for Phone Call        erx  epipen and doxy done - pcn allg added to list - thx Follow-up by: Newt Lukes MD,  December 23, 2010 12:31 PM  Additional Follow-up for Phone Call Additional follow up Details #1::        Pt advised and request referral to allergist. Pt states that she has become allergic to several new things recently (hair dye and different foods).  Additional Follow-up by: Margaret Pyle, CMA,  December 23, 2010 1:25 PM  New Problems: URTICARIA (ICD-708.9)  New Allergies: ! PENICILLIN V POTASSIUM (PENICILLIN V POTASSIUM) Additional Follow-up for Phone Call Additional follow up Details #2::    order for allergy consult done Newt Lukes MD  December 23, 2010 4:43 PM   New Problems: URTICARIA (ICD-708.9) New/Updated Medications: DOXYCYCLINE HYCLATE 100 MG TABS (DOXYCYCLINE HYCLATE) 1 by mouth two times a day x 7 days EPIPEN 2-PAK 0.3 MG/0.3ML DEVI (EPINEPHRINE) use as needed New Allergies: ! PENICILLIN V POTASSIUM (PENICILLIN V POTASSIUM)Prescriptions: EPIPEN 2-PAK 0.3 MG/0.3ML DEVI (EPINEPHRINE) use as needed  #1 pak x 0   Entered and Authorized by:   Newt Lukes MD   Signed by:   Newt Lukes MD on 12/23/2010   Method used:   [facility] to        [facility] [phone]* (retail)       [address] [place]  [address]       Ph: [phone] or [phone]       Fax: [phone]   RxID:   [id number] DOXYCYCLINE HYCLATE 100 MG TABS (DOXYCYCLINE HYCLATE) 1 by mouth two times a day x 7 days  #14 x 0   Entered and Authorized by:   Newt Lukes MD   Signed by:   Newt Lukes MD on 12/23/2010   Method used:   [facility]        [facility] [phone]* (retail)       [address]       [place], [place]  [address]       Ph: [phone] or [phone]       Fax: [phone]   RxID:   [id number]

## 2010-12-29 NOTE — Progress Notes (Signed)
Summary: pt? re: amox rx and er eval for pcn allg  Phone Note Call from Patient Call back at [facility] Phone [phone]   Caller: Patient Summary of Call: Pt called stating that she was Rxd Amoxicillan eventhough she is allergic to PCN and has stated so several times. I tried to explain to pt that she came to [place] from [facility] and her drug allergy would have been listed prior to her first visit but she had filled out the new patient information sheet several times an included this information. Pt also states that the pharmacy filled this medication as well and they were also aware that she has PNC allergy. Pt says this caused her to go to ER with allergic reactiojn of itching and hives. I started to advise pt that I will add to allergy list and advise MD but she stated that she will "have someone else take care of it" and hung up. Initial call taken by: Margaret Pyle, CMA,  December 23, 2010 10:23 AM  Follow-up for Phone Call        12/23/2010 10:21 AM, Daphane Shepherd wrote: Dr. Felicity Coyer, This patient called on the phone and was requesting that I give her her intake papers. I made patient aware that I did not understand what she was asking. Patient then stated that's okay because I am on my way there. I tried talking with the patient to understand what she was requesting. Patient asked for all of her records. I completed and had the patient sign an Authorization and gave her the records. Patient was upset because she wanted to know why I did not have the new patient paper work that she originally completed. The process was explained to the patient, who insisted that someone had just put the info into her records. Patient was very aggitated. Patient wanted to argue asking me why things were in or not in her chart. I once again explained the process and she stormed away.  Additional Follow-up for Phone Call Additional follow up Details #1::        above notes reviewed - it was not clear to me  at time of rx that pt was allg to pcn - i have updated her emr records to reflect same (see other phone note for alt abx and epi pen) - thx Additional Follow-up by: Newt Lukes MD,  December 23, 2010 12:34 PM

## 2010-12-30 ENCOUNTER — Ambulatory Visit (INDEPENDENT_AMBULATORY_CARE_PROVIDER_SITE_OTHER): Payer: BC Managed Care – PPO | Admitting: Internal Medicine

## 2010-12-30 ENCOUNTER — Encounter: Payer: Self-pay | Admitting: Internal Medicine

## 2010-12-30 DIAGNOSIS — L509 Urticaria, unspecified: Secondary | ICD-10-CM

## 2010-12-30 DIAGNOSIS — J309 Allergic rhinitis, unspecified: Secondary | ICD-10-CM

## 2010-12-30 DIAGNOSIS — J019 Acute sinusitis, unspecified: Secondary | ICD-10-CM

## 2011-01-04 NOTE — Assessment & Plan Note (Signed)
Summary: PER [name] SCHED----STC   Vital Signs:  Patient profile:   50 year old female Height:      68 inches (172.72 cm) O2 Sat:      98 % on Room air Temp:     98.4 degrees F (36.89 degrees C) oral Pulse rate:   88 / minute BP sitting:   110 / 82  (left arm) Cuff size:   large  Vitals Entered By: Orlan Leavens RMA (December 30, 2010 10:44 AM)  O2 Flow:  Room air CC: follow-up visit Is Patient Diabetic? No Pain Assessment Patient in pain? no      Comments Pt states had to go back to ER after second antibiotic, can't take doxycycline either. Add to allergies   Primary Care Provider:  Newt Lukes MD  CC:  follow-up visit.  History of Present Illness: here for f/u 2 er visits related to allg rxn to abx - 1st augmentin, then doxy has epi pen (not yet picked up) and refer to allg pending... sched 01/11/11 continued frontal sinus pressure - no fever or discharge no vision change, weakness - no SOB or wheeze - no current rash/hives -  no change in HA chronic symptoms   reviewed chronic med issues: allg rhinitis - not using claritin due to cost - sx complicates usual HA symptoms - see above  continued HA symptoms - daily - ongoing >6 months  no change in nature of pain or symptoms since onset not improved with OTC meds - no fever, no change in HA location or intenstiy located over L temporal region, then spreads to right no weakness, no vision change - no head trauma or injury, falls neuro eval pending  hypertriglyceridemia - never on rx for same - tries to follow low fat diet and exercise  htn hx- prev on meds but [name] >2 years - no CP, edema -   GERD - prev on nexium and zantac -  no abd pain, n or v; no hx PUD or BRBPR not using OTC meds due to ineffective relief of symptoms on same - on omeprazole starting 09/2010 - improved   Current Medications (verified): 1)  Omeprazole 20 Mg Cpdr (Omeprazole) .Marland Kitchen.. 1 By Mouth Once Daily 2)  Diclofenac Sodium 50 Mg Tbec  (Diclofenac Sodium) .Marland Kitchen.. 1 By Mouth Two Times A Day As Needed For Pain 3)  Zolpidem Tartrate 10 Mg Tabs (Zolpidem Tartrate) .... 1/2-1 By Mouth At Bedtime As Needed For Sleep 4)  Loratadine 10 Mg Tabs (Loratadine) .Marland Kitchen.. 1 By Mouth Once Daily 5)  Zanaflex 4 Mg Caps (Tizanidine Hcl) .Marland Kitchen.. 1 By Mouth Every 8 Hours As Needed For Muscle Spasm Pain 6)  Epipen 2-Pak 0.3 Mg/0.77ml Devi (Epinephrine) .... Use As Needed  Allergies: 1)  ! Penicillin V Potassium (Penicillin V Potassium) 2)  ! Doxycycline  Past History:  Past Medical History: Allergic rhinitis Anemia-NOS GERD  Hyperlipidemia - triglycerides  Hypertension hx headaches  Review of Systems  The patient denies weight loss, vision loss, decreased hearing, chest pain, syncope, dyspnea on exertion, peripheral edema, abdominal pain, muscle weakness, suspicious skin lesions, and transient blindness.    Physical Exam  General:  alert, well-developed, well-nourished, and cooperative to examination.    Head:  Normocephalic and atraumatic without obvious abnormalities. No apparent alopecia or balding. Eyes:  vision grossly intact; pupils equal, round and reactive to light.  conjunctiva and lids normal.    Ears:  R ear normal and L ear normal.   Mouth:  teeth and gums in good repair; mucous membranes moist, without lesions or ulcers. oropharynx clear without exudate, no erythema.  Lungs:  normal respiratory effort, no intercostal retractions or use of accessory muscles; normal breath sounds bilaterally - no crackles and no wheezes.    Heart:  normal rate, regular rhythm, no murmur, and no rub. BLE without edema.  Neurologic:  alert & oriented X3 and cranial nerves II-XII symetrically intact.  strength normal in all extremities, sensation intact to light touch, and gait normal. speech fluent without dysarthria or aphasia; follows commands with good comprehension.  Skin:  2.5" round large lipoma over right scapular region - no hives or  rash   Impression & Recommendations:  Problem # 1:  ACUTE SINUSITIS, UNSPECIFIED (ICD-461.9)  allg rxn to augmentin and doxy prompting ER eva and tx of same no fever, discharge or signs infx tx with nasal steroid and antihist - no further abx unless change symptoms  pending eval by allg due allg rxn and can eval/tx same at that time as needed  Her updated medication list for this problem includes:    Fluticasone Propionate 50 Mcg/act Susp (Fluticasone propionate) .Marland Kitchen... 2 sprays each nostril every morning  Orders: Prescription Created Electronically [id number])  Problem # 2:  URTICARIA (ICD-708.9) adv rxn to augmentin and doxy - med allg updated - allg eval pending - reviewed same with pt today in depth - no current reaction  Problem # 3:  ALLERGIC RHINITIS (ICD-477.9)  Her updated medication list for this problem includes:    Loratadine 10 Mg Tabs (Loratadine) .Marland Kitchen... 1 by mouth once daily    Fluticasone Propionate 50 Mcg/act Susp (Fluticasone propionate) .Marland Kitchen... 2 sprays each nostril every morning  Complete Medication List: 1)  Omeprazole 20 Mg Cpdr (Omeprazole) .Marland Kitchen.. 1 by mouth once daily 2)  Diclofenac Sodium 50 Mg Tbec (Diclofenac sodium) .Marland Kitchen.. 1 by mouth two times a day as needed for pain 3)  Zolpidem Tartrate 10 Mg Tabs (Zolpidem tartrate) .... 1/2-1 by mouth at bedtime as needed for sleep 4)  Loratadine 10 Mg Tabs (Loratadine) .Marland Kitchen.. 1 by mouth once daily 5)  Zanaflex 4 Mg Caps (Tizanidine hcl) .Marland Kitchen.. 1 by mouth every 8 hours as needed for muscle spasm pain 6)  Epipen 2-pak 0.3 Mg/0.35ml Devi (Epinephrine) .... Use as needed 7)  Fluticasone Propionate 50 Mcg/act Susp (Fluticasone propionate) .... 2 sprays each nostril every morning  Patient Instructions: 1)  it was good to see you today. 2)  use steroid nasal spray and continuegeneric claritin for your allergy and sinus symptoms - no antibiotics from me unless call with fever or other life threatening problem 3)  your prescriptions have  been electronically submitted or faxed to your pharmacy. Please take as directed. Contact our office if you believe you're having problems with the medication(s).  4)  Please keep scheduled follow-up appointment as planned to review blood pressure, allergy symptoms and headaches; call sooner if problems. will check your cholesterol every 6-12 months Prescriptions: FLUTICASONE PROPIONATE 50 MCG/ACT SUSP (FLUTICASONE PROPIONATE) 2 sprays each nostril every morning  #1 x 3   Entered and Authorized by:   Newt Lukes MD   Signed by:   Newt Lukes MD on 12/30/2010   Method used:   [facility] to        [facility] [phone]* (retail)       [address]       [place], [place]  [address]  Ph: [phone] or [phone]       Fax: [phone]   RxID:   [id number]    Orders Added: 1)  Est. Patient Level IV [40347] 2)  Prescription Created Electronically [id number]

## 2011-01-11 ENCOUNTER — Encounter: Payer: Self-pay | Admitting: Internal Medicine

## 2011-01-13 NOTE — Letter (Signed)
Summary: [facility]   Imported By: Sherian Rein 01/04/2011 09:00:51  _____________________________________________________________________  External Attachment:    Type:   Image     Comment:   External Document

## 2011-01-20 LAB — WET PREP, GENITAL
Trich, Wet Prep: NONE SEEN
WBC, Wet Prep HPF POC: NONE SEEN
Yeast Wet Prep HPF POC: NONE SEEN

## 2011-01-20 LAB — URINALYSIS, ROUTINE W REFLEX MICROSCOPIC
Bilirubin Urine: NEGATIVE
Glucose, UA: NEGATIVE mg/dL
Hgb urine dipstick: NEGATIVE
Specific Gravity, Urine: 1.023 (ref 1.005–1.030)
Urobilinogen, UA: 0.2 mg/dL (ref 0.0–1.0)
pH: 6.5 (ref 5.0–8.0)

## 2011-01-23 LAB — POCT URINALYSIS DIP (DEVICE)
Bilirubin Urine: NEGATIVE
Hgb urine dipstick: NEGATIVE
Ketones, ur: NEGATIVE mg/dL
Protein, ur: NEGATIVE mg/dL
Specific Gravity, Urine: 1.025 (ref 1.005–1.030)
pH: 5.5 (ref 5.0–8.0)

## 2011-01-25 NOTE — Letter (Signed)
Summary: [facility]  [facility]   Imported By: Sherian Rein 01/20/2011 11:52:16  _____________________________________________________________________  External Attachment:    Type:   Image     Comment:   External Document

## 2011-02-03 ENCOUNTER — Institutional Professional Consult (permissible substitution): Payer: BC Managed Care – PPO | Admitting: Internal Medicine

## 2011-02-07 ENCOUNTER — Other Ambulatory Visit: Payer: Self-pay | Admitting: *Deleted

## 2011-02-07 MED ORDER — ZOLPIDEM TARTRATE 10 MG PO TABS: 10.0000 mg | ORAL_TABLET | Freq: Every evening | ORAL | Status: DC | PRN

## 2011-02-07 NOTE — Telephone Encounter (Signed)
Ok to refill - please print prescription for me to sign - thanks

## 2011-02-07 NOTE — Telephone Encounter (Signed)
[facility] [address]...02/07/11@1 :40pm/LMB

## 2011-03-18 ENCOUNTER — Encounter: Payer: Self-pay | Admitting: Internal Medicine

## 2011-03-22 NOTE — Op Note (Signed)
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   PROCEDURE:  Laparoscopic-assisted vaginal hysterectomy, right salpingo-  oophorectomy with preservation left tube and ovary.   SURGEON:  Ginger Carne, M.D.   ASSISTANT:  Dr. Okey Dupre.   ESTIMATED BLOOD LOSS:  100 mL.   COMPLICATIONS:  None immediate.   SPECIMEN:  Uterus, cervix to pathology.   OPERATIVE FINDINGS:  External genitalia, vulva and vagina normal.  Cervix smooth without erosions or lesions.  Uterus was 18 weeks in size,  multinodular consistent with fibroids.  The right tube and ovary were  adherent to their respective uterine sidewall and therefore were  removed.  The left tube and ovary appeared normal.  No other pathology  in pelvis noted.   OPERATIVE PROCEDURE:  The patient prepped and draped in usual fashion  and placed lithotomy position.  Betadine solution used for antiseptic.  The patient was catheterized prior to procedure.  After adequate general  anesthesia a tenaculum was placed in the anterior lip of the cervix and  the Pilosa uterine manipulator placed in same.  Afterwards a Palmers  point incision was made.  The Veress needle was placed into incision.  Opening closing pressures 10-15 mmHg. The needle released, trocar placed  in same incision.  Laparoscope placed in trocar sleeve. Two 5 mm ports  were then made left lower quadrant and left hypogastric regions under  direct visualization.  The left tube and ovary were identified and the  utero-ovarian ligament on that side was bipolar cauterized  and cut  including the round ligament. The left side was difficult to reach with  a left-sided ports and was decided to deal with the structures from a  vaginal approach.  No active bleeding noted and attention was directed  to the vaginal portion of the procedure.   20 mL of Marcaine with epinephrine were injected circumferentially  around the cervix. 2 cm of anterior posterior vaginal epithelium were  incised transversely.  Afterwards the peritoneal reflections were  identified, opened without injury to their respective organs.  The  uterosacral cardinal ligament complexes were clamped, cut, and ligated  with 0 Vicryl suture.  These sutures were incorporated with the lateral  vaginal walls for suspension purposes.  In the standard Senaida Ores  fashion the uterine vasculature was clamped, cut and ligated with 0  Vicryl suture.  This extended to the broad ligament.  Standard coring  and wedging technique utilized to decompress the uterus.  The right  infundibulopelvic ligament was clamped, cut and ligated twice with  transfixed station 0 Vicryl sutures.  Bleeding points hemostatically  checked and closure of the cuff with 0 Vicryl running interlocking  suture  including the angles separately.  Relaparoscoping the patient was  performed to identify any sources of bleeding.  Bleeding points  hemostatically checked.  Blood clots removed, irrigant removed.  No  active bleeding noted at end of the procedure.  Afterwards gas released,  trocars removed.  Closure of the 10-mm fascia site with a 0 Vicryl  suture and 4-0 Vicryl for subcuticular closure.  Instrument and sponge  count were correct.  The patient tolerated the procedure well, returned  to the post anesthesia recovery room in excellent condition.      Ginger Carne, MD  Electronically Signed     SHB/MEDQ  D:  06/25/2007  T:  06/25/2007  Job:  [id number]

## 2011-03-22 NOTE — Group Therapy Note (Signed)
[name], [name] NO.:  [id number]   MEDICAL RECORD NO.:  [id number]          PATIENT TYPE:  WOC   LOCATION:  [facility]                   FACILITY:  [facility]   PHYSICIAN:  Ginger Carne, MD DATE OF BIRTH:  21-Jul-1961   DATE OF SERVICE:  06/08/2007                                  CLINIC NOTE   This is a 50 year old African American female known to our clinic who  returns for discussion pertaining to her fibroids.  The patient has had  longstanding history of abnormal uterine bleeding resulting in 10-14  days out of 30 days of heavy flow.  She had an endometrial biopsy  performed on May 16, 2007, indicating benign secretory endometrium  without hyperplasia or neoplasia.  Approximately 8 years ago, the  patient had a laparoscopic laser myomectomy in [place].  She is  uncertain as to the size of her fibroids at that time or the size of the  fibroids that were removed.  The patient denies pelvic pain/pressure,  takes no medications to enhance her bleeding propensity, and has no  personal family history of bleeding diatheses.   OB/GYN HISTORY:  The patient has had two normal vaginal deliveries.  She  is a G2 P2 and has had a bilateral tubal ligation 1985.   ALLERGIES:  PENICILLIN.   CURRENT MEDICATIONS:  Multivitamin and an iron tablet daily.   MEDICAL HISTORY:  The patient has hypertension.   PAST SURGICAL HISTORY:  Laser laparoscopic myomectomy about 8 years ago.   REVIEW OF SYSTEMS:  14-point comprehensive review of systems within  normal limits.  Specifically, no history of urinary stress or  incontinence.   FAMILY HISTORY:  Her mother has had a heart attack, and her mother has  also had hypertension.  No first-degree relatives with breast, colon,  ovarian, uterine carcinoma.   SOCIAL HISTORY:  The patient is a nonsmoker.  Denies alcohol or drug  abuse   PHYSICAL EXAMINATION:  VITAL SIGNS:  Blood pressure 134/87, weight 173  pounds, height 67  inches.  HEENT:  Grossly normal.  BREASTS EXAM:  Without masses, discharge, thickenings or tenderness.  CHEST:  Clear to percussion and auscultation.  CARDIOVASCULAR:  Without murmurs or enlargements, regular rate and  rhythm.  EXTREMITIES, LYMPHATIC, SKIN, NEUROLOGICAL, MUSCULOSKELETAL:  Systems  within normal limits.  ABDOMEN:  Reveals a 16-18 weeks' sized mass consistent with leiomyoma of  the uterus.  PELVIC EXAM:  Recent Pap smear normal dated 05/16/2007.  EXTERNAL GENITALIA:  Vulva and vagina normal.  Cervix smooth without  erosions or lesions.  Uterus is 18 weeks in size with leiomyoma noted on  the right aspect of the uterus in addition to the fundal portion.  Both  adnexa are palpable found to be normal.   IMPRESSION:  Symptomatic leiomyomatous uterus with menometrorrhagia.   PLAN:  The patient is interested in having a definitive surgery for said  lesions.  For this reason, a laparoscopic-assisted vaginal hysterectomy  and preservation of both tubes and ovaries were discussed and understood  by the patient.  Ashby Dawes of said procedure discussed in detail.  Risks  and benefits including possible injuries to ureter, bowel, and bladder,  possible conversion to an open procedure, hemorrhage possibly requiring  blood transfusion, infection and postoperative complications were  discussed and understood by said patient.  The patient understands that  this will render her sterile, and she has no desire for further  childbearing.  Other options including a myomectomy were discussed and  declined.           ______________________________  Ginger Carne, MD     SHB/MEDQ  D:  06/08/2007  T:  06/08/2007  Job:  [id number]

## 2011-03-22 NOTE — Discharge Summary (Signed)
[name], [name]                ACCOUNT NO.:  [id number]   MEDICAL RECORD NO.:  [id number]          PATIENT TYPE:  OIB   LOCATION:  9310                          [facility]   PHYSICIAN:  Ginger Carne, MD  DATE OF BIRTH:  September 05, 1961   DATE OF ADMISSION:  06/25/2007  DATE OF DISCHARGE:                               DISCHARGE SUMMARY   REASON FOR HOSPITALIZATION:  A 16 to 18-week leiomyomatous uterus with  menorrhagia.   IN-HOSPITAL PROCEDURES:  Laparoscopic-assisted vaginal hysterectomy,  right salpingo-oophorectomy with preservation of left tube and ovary.   FINAL DIAGNOSIS:  A 16 to 18-week leiomyomatous uterus with menorrhagia.   HOSPITAL COURSE:  This is a 50 year old [place] American female  multiparous who underwent the aforementioned procedure on June 25, 2007.  Intraoperative course was uneventful.  Postoperatively she was  afebrile, vital signs were stable.  Postoperative creatinine 0.84 and  hemoglobin 9.4 from 12.1 preoperatively.  Her abdominal incisions were  dry.  Calves without tenderness.  Lungs: Clear.  Scant vaginal flow and  her abdomen was soft.   The patient was discharged with routine postoperative instructions  including contacting the office for temperature elevation above 100.4  degrees Fahrenheit, increasing abdominal pain, vaginal bleeding, GU or  GI complaints.  The patient was requested to return in 4 weeks for  routine postoperative care at the GYN clinic.   DISCHARGE MEDICATIONS:  Home medications were continued including:  1. Nexium 40 mg in the morning daily.  2. Mucinex.  3. Dilaudid 2 mg one every 6-8 hours as needed.  4. Hydrochlorothiazide 50 mg daily.   The patient was given a prescription for an additional 30 tablets of 2  mg Dilaudid tablets.  All questions answered to the satisfaction of said  patient and the patient verbalized understanding of same.      Ginger Carne, MD  Electronically Signed     SHB/MEDQ   D:  06/26/2007  T:  06/26/2007  Job:  [id number]

## 2011-03-22 NOTE — Group Therapy Note (Signed)
NAMELESLEYANN, FICHTER NO.:  [id number]   MEDICAL RECORD NO.:  [id number]          PATIENT TYPE:  WOC   LOCATION:  [facility]                   FACILITY:  [facility]   PHYSICIAN:  Argentina Donovan, MD        DATE OF BIRTH:  1961/05/13   DATE OF SERVICE:  05/29/2008                                  CLINIC NOTE   The patient is a 50 year old African American female who underwent  hysterectomy in October 2009 for uterine fibroids.  She maintained her  ovaries.  She is scheduled for mammogram next month.  She has no  complaints, and is in for an annual exam.  I have assured her she does  not need Pap smears any more since her hysterectomy was not done for  cancer.   PHYSICAL EXAMINATION:  BREASTS:  Breasts were symmetrical with no  dominant masses.  No nipple discharge and no lymphadenopathy noted.  ABDOMEN:  Soft, flat, nontender.  No masses or organomegaly.  GENITALIA:  External genitalia is normal.  BUS within normal limits.  Vagina is clean and well-rugated and status  hysterectomy.  Adnexa could  not be palpated because of the habitus of the patient.   IMPRESSION:  Normal gynecological examination.  Return in 1 year for a  repeat.           ______________________________  Argentina Donovan, MD     PR/MEDQ  D:  05/29/2008  T:  05/29/2008  Job:  [id number]

## 2011-03-22 NOTE — Discharge Summary (Signed)
[name], [name] NO.:  [id number]   MEDICAL RECORD NO.:  [id number]          PATIENT TYPE:  INP   LOCATION:                                [facility]   PHYSICIAN:  Ginger Carne, MD  DATE OF BIRTH:  14-May-1961   DATE OF ADMISSION:  06/27/2007  DATE OF DISCHARGE:  07/03/2007                               DISCHARGE SUMMARY   REASON FOR HOSPITALIZATION:  Postoperative pelvic pain and fever.   IN HOSPITAL PROCEDURES:  CT scan with contrast of abdomen and pelvis and  placement on antibiotics.   IN HOSPITAL CONSULTATIONS:  General Surgery.  Infectious Disease.   FINAL DIAGNOSIS:  Postoperative pelvic inflammatory process.   HOSPITAL COURSE:  This is a 49 year old African American female who  underwent a laparoscopic-assisted vaginal hysterectomy and right  salpingo-oophorectomy on June 25, 2007, because of a leiomyomatous  uterus and dysfunctional uterine bleeding.  She was discharged the next  day without complications.  She was readmitted on August 20 with  complaint of fever and lower abdominal discomfort.  She had some nausea  and vomiting.  On admission, her initial CT scan with contrast of  abdomen and pelvis was normal.  White count normal.  Hemoglobin and  hematocrit within normal limits.  Follow up two days later on CT scan  with contrast demonstrated inflammatory process of the rectosigmoid  colon and several loops of bowel.  The appendix was normal.  Her  temperature had risen to 103.1 degrees T-max on postop day #2.  She was  started on vancomycin in addition to clindamycin and metronidazole.  After Infectious Disease and Surgical Management, the decision was made  to continue IV vancomycin with Avelox intravenously and metronidazole.  During the course of her hospitalization, her lower abdominal pain,  which was midline and between her symphysis pubis and umbilicus, had  diminished significantly.  In addition, the patient's white count  remained normal at all times including hemoglobin and hematocrit.  The  patient's urinalysis was normal as well as chest x-ray.  She did  demonstrate on one of three fecal cultures, positive clostridia  difficile toxin A and B.   The patient was on metronidazole to cover clostridia in addition to the  other two antibiotics for control of inflammatory process.  The CT scan,  at no time, demonstrated evidence for bowel perforation, ureteral injury  or other obvious surgical injurious sites.  The appendix was carefully  visualized, and was noted that it was not involved in set inflammatory  process.  CT scan on the morning of admission demonstrated no  significant changes and findings from three days prior CT scan.  The  patient remained afebrile for 48 hours and was therefore discharged.   DISCHARGE MEDICATIONS:  The patient was discharged with:  1. Avelox 400 mg daily for five days.  2. Metronidazole 500 mg twice a day for 14 days.  3. She was prescribed Xanax 0.5 mg  one up to 3-4 times a day as      needed.  4. Dilaudid 2 mg 1-2 q.6 h  as needed.   DISCHARGE INSTRUCTIONS:  She was advised to contact the office for  temperature elevation above 100.4 degrees Fahrenheit, increasing  abdominal pain, vaginal bleeding, GI or GU complaints and/or diarrhea.  The patient was encouraged to continue her metronidazole for two weeks.  She will be followed in the GYN clinic in two weeks as well.  All  questions answered to the satisfaction of said patient, and patient  verbalized understanding of same.      Ginger Carne, MD  Electronically Signed     SHB/MEDQ  D:  07/03/2007  T:  07/03/2007  Job:  [id number]

## 2011-03-22 NOTE — Consult Note (Signed)
[name], SON NO.:  [id number]   MEDICAL RECORD NO.:  [id number]           PATIENT TYPE:   LOCATION:                                [facility]   PHYSICIAN:  Wilmon Arms. Corliss Skains, M.D. DATE OF BIRTH:  08/21/1961   DATE OF CONSULTATION:  06/30/2007  DATE OF DISCHARGE:  06/08/2007                                 CONSULTATION   REASON FOR CONSULTATION:  Fever and pelvic pain after hysterectomy.   HISTORY OF PRESENT ILLNESS:  The patient is a 50 year old female who is  status post laparoscopic-assisted vaginal hysterectomy and right  salpingo-oophorectomy on June 25, 2007, by Dr. Mia Creek.  The  indications for the procedure were dysfunction uterine bleeding and  multiple leiomyoma.  The patient was discharged on August 19 and was  readmitted on August 20 with fever and lower abdominal pain.  She was  having some nausea and vomiting as well.  Her symptoms have persisted  since admission.  She had a CT scan upon readmission which was normal.  White count was normal.  Her CT scan was repeated today which showed a  1.5 cm fluid collection in the area of the right adnexal region with  some pockets of air consistent with abscess.  There are also  inflammatory changes followed in the sigmoid colon, the rectum, as well  as the distal ileum.  There was no clear sign of appendicitis.  The  patient has been having diarrhea, nonbloody as well as occasional  nausea.  Her pain is located in the infraumbilical region in the  midline.  There is also some pain off to the right side.  She denies any  generalized peritoneal signs.   PAST MEDICAL HISTORY:  Hypertension, hyperlipidemia.   PAST SURGICAL HISTORY:  1. Bilateral tubal ligation with supervision wound dehiscence.  2. Laparoscopic assisted vaginal hysterectomy and right salpingo-      oophorectomy June 25, 2007.   ALLERGIES:  PENICILLIN WHICH CAUSES A RASH, ASPIRIN AND QUESTIONABLE  PERCOCET ALLERGY.   CURRENT MEDICATIONS:  Protonix, hydrochlorothiazide, therapeutic Lovenox  which is currently being held, vancomycin and Cipro.   SOCIAL HISTORY:  Nonsmoker, occasional alcohol use.   PHYSICAL EXAMINATION:  VITAL SIGNS:  T-max 102.0 at 10 o'clock last  night, currently temperature 98.8, pulse 92, blood pressure 105/66,  respirations 20, sats 96% on room air.  This is a well-developed, well-  nourished female in no apparent distress.  She seems frustrated and  angry about her current situation.  HEENT:  EOMI.  Sclerae anicteric.  NECK:  No mass or thyromegaly.  LUNGS:  Clear.  Normal respiratory effort.  HEART:  Regular rate and rhythm.  No murmur.  ABDOMEN:  Positive bowel sounds, soft, nondistended, tender in the  infraumbilical midline near her previous tubal ligation incision.  She  is also mildly tender in the right lower quadrant.  There is no rebound  and minimal guarding.  No other abdominal pain.  Lower extremities show  no sign of swelling or tenderness.   STUDIES:  CT scan June 27, 2007, showed no unusual  postoperative  changes.  On August 23, CT scan of the abdomen and pelvis showed a 1.5  cm fluid collection with air in the right adnexal region with associated  inflammatory changes involving the sigmoid colon, rectum and distal  ileum as well as the distal right ureter.  No sign of hydronephrosis.   LABORATORY DATA:  C.  Diff negative.  White count today 5.7, hemoglobin  9.4, platelet count 300, potassium is 2.7.   IMPRESSION:  1. Postop pelvic abscess after a laparoscopic assisted vaginal      hysterectomy, right salpingo-oophorectomy.  2. No clear evidence of rectal, colon or small bowel injury.  The CT      changes are likely secondary to inflammation.  3. Hypokalemia.   RECOMMENDATIONS:  1. Would repeat a Clostridium difficile titer.  2. Add Flagyl to the antibiotic regimen for anaerobic coverage.  3. Keep the patient n.p.o. except for ice chips.  4. Change  medications to IV if possible.  5. Hold anticoagulation for now.   We will continue to follow with you.  We may need to repeat the scan in  a couple of days to see if there has been any change in the abscess.      Wilmon Arms. Tsuei, M.D.  Electronically Signed     MKT/MEDQ  D:  06/30/2007  T:  07/01/2007  Job:  [id number]

## 2011-03-23 ENCOUNTER — Ambulatory Visit: Payer: BC Managed Care – PPO | Admitting: Internal Medicine

## 2011-03-23 DIAGNOSIS — Z0289 Encounter for other administrative examinations: Secondary | ICD-10-CM

## 2011-03-25 NOTE — H&P (Signed)
[name], [name]                ACCOUNT NO.:  [id number]   MEDICAL RECORD NO.:  [id number]          PATIENT TYPE:  INP   LOCATION:  1824                         FACILITY:  [facility]   PHYSICIAN:  Wilson Singer, M.D.DATE OF BIRTH:  07-25-1961   DATE OF ADMISSION:  02/28/2007  DATE OF DISCHARGE:                              HISTORY & PHYSICAL   HISTORY:  This is a 50 year old lady who has a background history of  hypertension, but not on any medications, hyperlipidemia who now  presents with a four-hour history of palpitations associated with chest  pressure and some lightheadedness although without any loss of  consciousness.  She says for the last two or three weeks she has been  having some palpitations, but not as severe as this.  She now feels fine  after having had palpitations from 12 noon to about 4:00 p.m.  There is  no history of diabetes or family history of early coronary artery  disease.  There is history of hyperlipidemia for which she has had to be  on medications previously.   PAST MEDICAL HISTORY:  1. Hypertension.  2. Hyperlipidemia as mentioned above.   PAST SURGICAL HISTORY:  1. Bilateral tubal ligation in 1995.  2. Laparoscopy 1998 for fibroids.   SOCIAL HISTORY:  She has been married for ten years.  She is a  nonsmoker.  She occasionally drinks alcohol.  She is a Estate manager/land agent.   FAMILY HISTORY:  Mother died at 51, but of unclear etiology; she never  had a documented myocardial infarction.  She never knew her father.  She  has siblings, but they do not have any history of coronary artery  disease.   MEDICATIONS:  None.   ALLERGIES:  PENICILLIN WHICH PRODUCES A RASH.   REVIEW OF SYSTEMS:  Apart from the symptoms mentioned above, there are  no other symptoms referable to all other systems.   PHYSICAL EXAMINATION:  VITAL SIGNS:  Temperature 97.4, blood pressure  138/83, pulse 70 and in sinus rhythm, saturation 100%.  CARDIOVASCULAR EXAMINATION:   Heart sounds are present and normal.  No  murmurs or added sounds.  There  is no pericardial rub.  RESPIRATORY:  Lung fields are clear.  There is no pleural rub.  There  are no crackles.  ABDOMEN:  Soft and nontender.  No hepatosplenomegaly.  NEUROLOGICAL:  She is alert and orientated with no focal neurologic  signs.   INVESTIGATIONS:  Electrocardiogram done in the emergency room shows  normal sinus rhythm and is within normal limits with no acute ST-T wave  changes.   Sodium 138, potassium 3.8, chloride 112, BUN 17, creatinine 1.2, glucose  93, troponin 0.05, hemoglobin 10.5   IMPRESSION:  1. Chest pain/palpitations.  2. History of hypertension.   PLAN:  1. Admit to telemetry.  2. Rule out myocardial infarction.  3. GI cocktail to see if this will help some of her symptoms.  This is      an atypical presentation of cardiac pain.   The patient is very unkeen to stay in the hospital, but I have  encouraged her to do so.  I hope she will stay.  Further recommendations  will depend on her progress.      Wilson Singer, M.D.  Electronically Signed     NCG/MEDQ  D:  02/28/2007  T:  02/28/2007  Job:  [id number]

## 2011-03-25 NOTE — Discharge Summary (Signed)
[name], [name]                ACCOUNT NO.:  [id number]   MEDICAL RECORD NO.:  [id number]          PATIENT TYPE:  INP   LOCATION:  4733                         FACILITY:  [facility]   PHYSICIAN:  Wilson Singer, M.D.DATE OF BIRTH:  1960-11-19   DATE OF ADMISSION:  02/28/2007  DATE OF DISCHARGE:  03/01/2007                               DISCHARGE SUMMARY   FINAL DISCHARGE DIAGNOSES:  1. Microcytic anemia secondary to menorrhagia.  2. Palpitations with sinus tachycardia secondary to anemia.  3. Chest pain, atypical.   CONDITION ON DISCHARGE:  Stable.   MEDICATIONS ON DISCHARGE:  Ferrous sulfate 325 mg b.i.d.   HISTORY:  This 50 year old lady came in with atypical chest pain and  palpitations.  Please see initial history and physical examination done  by myself.   HOSPITAL PROGRESS:  She was admitted to the telemetry floor and serial  cardiac enzymes and electrocardiograms were done.  They were all  negative for any evidence of ischemia.  The following day, she looked  well without any chest pain and it was noted that she did have a  hemoglobin of 8.5 with a reduced MCV of 71.2.  She told me that she had  significant menorrhagia and this was probably the likely cause of her  palpitations also with anemia.  I gave her prescription for ferrous  sulfate to be taken and also told her that she must follow up with her  primary care physician in one to two weeks for followup of her  hemoglobin.      Wilson Singer, M.D.  Electronically Signed     NCG/MEDQ  D:  03/30/2007  T:  03/30/2007  Job:  [id number]

## 2011-04-23 ENCOUNTER — Emergency Department (HOSPITAL_COMMUNITY): Payer: BC Managed Care – PPO

## 2011-04-23 ENCOUNTER — Emergency Department (HOSPITAL_COMMUNITY)
Admission: EM | Admit: 2011-04-23 | Discharge: 2011-04-23 | Disposition: A | Discharge status: Home / Self Care | Payer: BC Managed Care – PPO | Attending: Emergency Medicine | Admitting: Emergency Medicine

## 2011-04-23 DIAGNOSIS — K219 Gastro-esophageal reflux disease without esophagitis: Secondary | ICD-10-CM | POA: Insufficient documentation

## 2011-04-23 DIAGNOSIS — M25579 Pain in unspecified ankle and joints of unspecified foot: Secondary | ICD-10-CM | POA: Insufficient documentation

## 2011-04-28 ENCOUNTER — Emergency Department (HOSPITAL_COMMUNITY)
Admission: EM | Admit: 2011-04-28 | Discharge: 2011-04-28 | Disposition: A | Discharge status: Home / Self Care | Payer: BC Managed Care – PPO | Attending: Emergency Medicine | Admitting: Emergency Medicine

## 2011-04-28 DIAGNOSIS — K219 Gastro-esophageal reflux disease without esophagitis: Secondary | ICD-10-CM | POA: Insufficient documentation

## 2011-04-28 DIAGNOSIS — R21 Rash and other nonspecific skin eruption: Secondary | ICD-10-CM | POA: Insufficient documentation

## 2011-07-29 ENCOUNTER — Encounter (HOSPITAL_COMMUNITY): Payer: Self-pay

## 2011-07-29 ENCOUNTER — Inpatient Hospital Stay (HOSPITAL_COMMUNITY)
Admission: AD | Admit: 2011-07-29 | Discharge: 2011-07-29 | Disposition: A | Discharge status: Home / Self Care | Payer: BC Managed Care – PPO | Source: Ambulatory Visit | Attending: Obstetrics & Gynecology | Admitting: Obstetrics & Gynecology

## 2011-07-29 ENCOUNTER — Inpatient Hospital Stay (HOSPITAL_COMMUNITY): Payer: BC Managed Care – PPO

## 2011-07-29 DIAGNOSIS — R109 Unspecified abdominal pain: Secondary | ICD-10-CM | POA: Insufficient documentation

## 2011-07-29 DIAGNOSIS — A499 Bacterial infection, unspecified: Secondary | ICD-10-CM

## 2011-07-29 DIAGNOSIS — N760 Acute vaginitis: Secondary | ICD-10-CM

## 2011-07-29 DIAGNOSIS — M549 Dorsalgia, unspecified: Secondary | ICD-10-CM

## 2011-07-29 DIAGNOSIS — B9689 Other specified bacterial agents as the cause of diseases classified elsewhere: Secondary | ICD-10-CM | POA: Insufficient documentation

## 2011-07-29 LAB — URINALYSIS, ROUTINE W REFLEX MICROSCOPIC
Glucose, UA: NEGATIVE mg/dL
Hgb urine dipstick: NEGATIVE
Specific Gravity, Urine: 1.015 (ref 1.005–1.030)
Urobilinogen, UA: 0.2 mg/dL (ref 0.0–1.0)
pH: 6 (ref 5.0–8.0)

## 2011-07-29 LAB — WET PREP, GENITAL
Trich, Wet Prep: NONE SEEN
Yeast Wet Prep HPF POC: NONE SEEN

## 2011-07-29 LAB — CBC
HCT: 36.7 % (ref 36.0–46.0)
MCH: 27.2 pg (ref 26.0–34.0)
MCHC: 34.3 g/dL (ref 30.0–36.0)
WBC: 5.4 10*3/uL (ref 4.0–10.5)

## 2011-07-29 MED ORDER — METRONIDAZOLE 500 MG PO TABS: 500.0000 mg | ORAL_TABLET | Freq: Two times a day (BID) | ORAL | Status: AC

## 2011-07-29 MED ORDER — IBUPROFEN 600 MG PO TABS: 600.0000 mg | ORAL_TABLET | Freq: Four times a day (QID) | ORAL | Status: AC | PRN

## 2011-07-29 NOTE — Progress Notes (Signed)
Pt states has had bladder irritation x1 week. No burning, feels pressure. Denies vaginal d/c or bleeding. Noted urine to be cloudy.

## 2011-07-29 NOTE — Progress Notes (Signed)
Pt states she started having R flank pain for a couple of weeks, lower abdominal pressure started this am. States urine is cloudy and has frequency.

## 2011-07-29 NOTE — ED Provider Notes (Signed)
Subjective  This is a 50 year old G2 P2 who gives a several  week history of having right flank discomfort and cloudy-appearing urine. She describes bladder irritation or tingling but not dysuria for about the past week. She has abd bloating suprapubic pressure sensation. She does endorse urinary frequency but no urgency or gross hematuria. Also reports vaginal irritation and tingling and mild dysparunia. She had a hysterectomy for fibroids and unilateral oophorectomy, uncertain which side. Has her annual exam with PMD at [facility] scheduled for November 2012.  Review of Systems  Constitutional: Negative for fever and chills.       Weight gain  Gastrointestinal: Negative for nausea, vomiting, abdominal pain, diarrhea and constipation.  Genitourinary: Positive for frequency and flank pain. Negative for dysuria, urgency and hematuria.   Past Medical History  Diagnosis Date  . 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 HYPERLIPIDEMIA 09/21/2010  . ANEMIA-NOS 09/21/2010  . HYPERTENSION 09/21/2010  . ALLERGIC RHINITIS 09/21/2010  . GERD 09/21/2010  . BACK PAIN, LUMBAR 09/21/2010  . Acute sinusitis, unspecified 12/22/2010  . 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 Headache 12/22/2010   Past Surgical History  Procedure Date  . Abdominal hysterectomy 2007  . (r) shoulder lipoma excision 12/2010    [facility]   No current facility-administered medications on file prior to encounter.   Current Outpatient Prescriptions on File Prior to Encounter  Medication Sig Dispense Refill  . omeprazole (PRILOSEC) 20 MG capsule Take 20 mg by mouth daily.       . diclofenac (VOLTAREN) 50 MG EC tablet Take 50 mg by mouth 2 (two) times daily as needed.        Marland Kitchen EPINEPHrine (EPIPEN 2-PAK) 0.3 mg/0.3 mL DEVI Inject 0.3 mg into the muscle once as needed.        . fluticasone (FLONASE) 50 MCG/ACT nasal spray 2 sprays by Nasal route every morning.        . Loratadine 10 MG CAPS Take by mouth daily.        Marland Kitchen tiZANidine (ZANAFLEX) 4 MG tablet Take 4 mg  by mouth every 8 (eight) hours as needed.        . zolpidem (AMBIEN) 10 MG tablet Take 1 tablet (10 mg total) by mouth at bedtime as needed. Take 1/2-1 tablet by mouth at bedtime as needed for sleep  30 tablet  1   Allergies  Allergen Reactions  . Penicillins Hives  . Doxycycline Rash   History   Social History  . Marital Status: Legally Separated    Spouse Name: N/A    Number of Children: N/A  . Years of Education: N/A   Occupational History  . Not on file.   Social History Main Topics  . Smoking status: Never Smoker   . Smokeless tobacco: Not on file   Comment: Divorced, lives with exhusband at this time, has boyfriend  . Alcohol Use: No  . Drug Use: No  . Sexually Active: Yes   Other Topics Concern  . Not on file   Social History Narrative  . No narrative on file   Objective  Filed Vitals:   07/29/11 0737  BP: 142/95  Pulse: 79  Temp: 98.4 F (36.9 C)  Resp: 20   Results for orders placed during the hospital encounter of 07/29/11 (from the past 24 hour(s))  URINALYSIS, ROUTINE W REFLEX MICROSCOPIC     Status: Normal   Collection Time   07/29/11  7:45 AM      Component Value Range   Color, Urine YELLOW  YELLOW    Appearance CLEAR  CLEAR    Specific Gravity, Urine 1.015  1.005 - 1.030    pH 6.0  5.0 - 8.0    Glucose, UA NEGATIVE  NEGATIVE (mg/dL)   Hgb urine dipstick NEGATIVE  NEGATIVE    Bilirubin Urine NEGATIVE  NEGATIVE    Ketones, ur NEGATIVE  NEGATIVE (mg/dL)   Protein, ur NEGATIVE  NEGATIVE (mg/dL)   Urobilinogen, UA 0.2  0.0 - 1.0 (mg/dL)   Nitrite NEGATIVE  NEGATIVE    Leukocytes, UA NEGATIVE  NEGATIVE     Physical exam  Gen: Pleasant in NAD Abdomen: soft mildly obese, normal bowel sounds, moderately tender right lower quadrant greater  than left lower quadrant on deep palpation, minimal guarding, no rebound, no masses. Pelvic: External genitalia normal, BUS negative. Vagina pink with moderate amount of white discharge  in the vault. Cervix   surgically absent. Bimanual: Mild to moderate tenderness right suprapubic  greater than left. No definite mass but exam difficult due to body habitus. Results for orders placed during the hospital encounter of 07/29/11 (from the past 24 hour(s))  URINALYSIS, ROUTINE W REFLEX MICROSCOPIC     Status: Normal   Collection Time   07/29/11  7:45 AM      Component Value Range   Color, Urine YELLOW  YELLOW    Appearance CLEAR  CLEAR    Specific Gravity, Urine 1.015  1.005 - 1.030    pH 6.0  5.0 - 8.0    Glucose, UA NEGATIVE  NEGATIVE (mg/dL)   Hgb urine dipstick NEGATIVE  NEGATIVE    Bilirubin Urine NEGATIVE  NEGATIVE    Ketones, ur NEGATIVE  NEGATIVE (mg/dL)   Protein, ur NEGATIVE  NEGATIVE (mg/dL)   Urobilinogen, UA 0.2  0.0 - 1.0 (mg/dL)   Nitrite NEGATIVE  NEGATIVE    Leukocytes, UA NEGATIVE  NEGATIVE   WET PREP, GENITAL     Status: Abnormal   Collection Time   07/29/11  9:19 AM      Component Value Range   Yeast, Wet Prep NONE SEEN  NONE SEEN    Trich, Wet Prep NONE SEEN  NONE SEEN    Clue Cells, Wet Prep MANY (*) NONE SEEN    WBC, Wet Prep HPF POC NONE SEEN  NONE SEEN   CBC     Status: Normal   Collection Time   07/29/11  9:20 AM      Component Value Range   WBC 5.4  4.0 - 10.5 (K/uL)   RBC 4.63  3.87 - 5.11 (MIL/uL)   Hemoglobin 12.6  12.0 - 15.0 (g/dL)   HCT 16.1  09.6 - 04.5 (%)   MCV 79.3  78.0 - 100.0 (fL)   MCH 27.2  26.0 - 34.0 (pg)   MCHC 34.3  30.0 - 36.0 (g/dL)   RDW 40.9  81.1 - 91.4 (%)   Platelets 208  150 - 400 (K/uL)  [place] Nl left ovary  Assessment/plan  BV- rx Flagyl  Abdominal pain may be due to adhesions. Advised abnormal labs and normal ultrasound and back it is her right ovary that was removed./General measures for relief of her back pain 2-blade musculoskeletal. Advised back pain relief measures and rx ibuprofen.

## 2011-08-04 LAB — POCT URINALYSIS DIP (DEVICE)
Glucose, UA: NEGATIVE
Hgb urine dipstick: NEGATIVE
Ketones, ur: NEGATIVE
Specific Gravity, Urine: 1.015
Urobilinogen, UA: 0.2

## 2011-08-15 ENCOUNTER — Other Ambulatory Visit: Payer: Self-pay | Admitting: Internal Medicine

## 2011-08-19 LAB — CBC
HCT: 26.5 — ABNORMAL LOW
HCT: 27.6 — ABNORMAL LOW
HCT: 35.1 — ABNORMAL LOW
Hemoglobin: 10.2 — ABNORMAL LOW
Hemoglobin: 12.1
Hemoglobin: 8.8 — ABNORMAL LOW
Hemoglobin: 9.2 — ABNORMAL LOW
Hemoglobin: 9.4 — ABNORMAL LOW
MCHC: 34.1
MCHC: 34.7
MCHC: 34.8
MCV: 77.3 — ABNORMAL LOW
MCV: 78.7
Platelets: 248
Platelets: 248
Platelets: 300
Platelets: 300
RBC: 3.19 — ABNORMAL LOW
RBC: 3.31 — ABNORMAL LOW
RBC: 3.4 — ABNORMAL LOW
RBC: 3.53 — ABNORMAL LOW
RBC: 4.5
RDW: 14.5 — ABNORMAL HIGH
RDW: 14.7 — ABNORMAL HIGH
RDW: 14.9 — ABNORMAL HIGH
WBC: 5.1
WBC: 5.7
WBC: 5.9
WBC: 6.9
WBC: 7.4

## 2011-08-19 LAB — BASIC METABOLIC PANEL
BUN: 3 — ABNORMAL LOW
CO2: 28
Calcium: 8.3 — ABNORMAL LOW
Calcium: 9.3
Chloride: 100
Chloride: 104
Creatinine, Ser: 0.76
Creatinine, Ser: 0.84
GFR calc Af Amer: >60
GFR calc Af Amer: >60
GFR calc Af Amer: >60
GFR calc Af Amer: >60
GFR calc non Af Amer: >60
GFR calc non Af Amer: >60
GFR calc non Af Amer: >60
Glucose, Bld: 97
Potassium: 3.2 — ABNORMAL LOW
Potassium: 3.4 — ABNORMAL LOW
Potassium: 3.4 — ABNORMAL LOW
Sodium: 132 — ABNORMAL LOW
Sodium: 137
Sodium: 139

## 2011-08-19 LAB — DIFFERENTIAL
Basophils Absolute: 0
Basophils Absolute: 0
Basophils Relative: 0
Eosinophils Absolute: 0.2
Eosinophils Relative: 1
Eosinophils Relative: 3
Lymphocytes Relative: 10 — ABNORMAL LOW
Lymphocytes Relative: 13
Lymphocytes Relative: 17
Lymphs Abs: 1
Monocytes Absolute: 0.6
Monocytes Absolute: 0.6
Monocytes Absolute: 0.8 — ABNORMAL HIGH
Monocytes Relative: 11
Neutro Abs: 5.5
Neutro Abs: 7.4

## 2011-08-19 LAB — URINALYSIS, ROUTINE W REFLEX MICROSCOPIC
Glucose, UA: NEGATIVE
Ketones, ur: NEGATIVE
Nitrite: NEGATIVE
Protein, ur: NEGATIVE

## 2011-08-19 LAB — AMYLASE: Amylase: 70

## 2011-08-19 LAB — COMPREHENSIVE METABOLIC PANEL
ALT: 19
AST: 20
Albumin: 2.7 — ABNORMAL LOW
Chloride: 98
Creatinine, Ser: 1.07
GFR calc Af Amer: >60
Potassium: 2.7 — CL
Sodium: 136
Total Bilirubin: 0.7

## 2011-08-19 LAB — TYPE AND SCREEN
ABO/RH(D): B NEG
Antibody Screen: NEGATIVE

## 2011-08-19 LAB — CLOSTRIDIUM DIFFICILE EIA
C difficile Toxins A+B, EIA: NEGATIVE
C difficile Toxins A+B, EIA: NEGATIVE

## 2011-08-19 LAB — ABO/RH: ABO/RH(D): B NEG

## 2011-08-23 LAB — POCT PREGNANCY, URINE
Operator id: 148111
Preg Test, Ur: NEGATIVE

## 2011-09-22 ENCOUNTER — Other Ambulatory Visit: Payer: Self-pay | Admitting: Internal Medicine

## 2011-09-22 DIAGNOSIS — Z1231 Encounter for screening mammogram for malignant neoplasm of breast: Secondary | ICD-10-CM

## 2011-10-03 ENCOUNTER — Encounter: Payer: Self-pay | Admitting: Internal Medicine

## 2011-10-03 ENCOUNTER — Other Ambulatory Visit (INDEPENDENT_AMBULATORY_CARE_PROVIDER_SITE_OTHER): Payer: BC Managed Care – PPO

## 2011-10-03 ENCOUNTER — Ambulatory Visit (INDEPENDENT_AMBULATORY_CARE_PROVIDER_SITE_OTHER): Payer: BC Managed Care – PPO | Admitting: Internal Medicine

## 2011-10-03 ENCOUNTER — Other Ambulatory Visit: Payer: Self-pay | Admitting: Internal Medicine

## 2011-10-03 VITALS — BP 122/90 | HR 82 | Temp 98.3°F | Wt 187.6 lb

## 2011-10-03 DIAGNOSIS — Z0000 Encounter for general adult medical examination without abnormal findings: Secondary | ICD-10-CM

## 2011-10-03 DIAGNOSIS — Z202 Contact with and (suspected) exposure to infections with a predominantly sexual mode of transmission: Secondary | ICD-10-CM

## 2011-10-03 DIAGNOSIS — M6580 Other synovitis and tenosynovitis, unspecified site: Secondary | ICD-10-CM

## 2011-10-03 DIAGNOSIS — R200 Anesthesia of skin: Secondary | ICD-10-CM

## 2011-10-03 DIAGNOSIS — R221 Localized swelling, mass and lump, neck: Secondary | ICD-10-CM

## 2011-10-03 DIAGNOSIS — R220 Localized swelling, mass and lump, head: Secondary | ICD-10-CM

## 2011-10-03 DIAGNOSIS — R209 Unspecified disturbances of skin sensation: Secondary | ICD-10-CM

## 2011-10-03 LAB — URINALYSIS
Hgb urine dipstick: NEGATIVE
Nitrite: NEGATIVE
Specific Gravity, Urine: 1.025 (ref 1.000–1.030)
Total Protein, Urine: NEGATIVE
Urobilinogen, UA: 0.2 (ref 0.0–1.0)

## 2011-10-03 LAB — BASIC METABOLIC PANEL
BUN: 18 mg/dL (ref 6–23)
Calcium: 9.3 mg/dL (ref 8.4–10.5)
Chloride: 110 mEq/L (ref 96–112)
Creatinine, Ser: 0.9 mg/dL (ref 0.4–1.2)
Glucose, Bld: 87 mg/dL (ref 70–99)

## 2011-10-03 LAB — LIPID PANEL
Cholesterol: 165 mg/dL (ref 0–200)
Total CHOL/HDL Ratio: 4
Triglycerides: 250 mg/dL — ABNORMAL HIGH (ref 0.0–149.0)

## 2011-10-03 LAB — CBC WITH DIFFERENTIAL/PLATELET
Eosinophils Absolute: 0.2 10*3/uL (ref 0.0–0.7)
Eosinophils Relative: 2.7 % (ref 0.0–5.0)
Lymphocytes Relative: 32 % (ref 12.0–46.0)
MCV: 81.5 fl (ref 78.0–100.0)
Monocytes Absolute: 0.5 10*3/uL (ref 0.1–1.0)
Neutrophils Relative %: 57.5 % (ref 43.0–77.0)
Platelets: 257 10*3/uL (ref 150.0–400.0)
RBC: 4.57 Mil/uL (ref 3.87–5.11)
WBC: 6.4 10*3/uL (ref 4.5–10.5)

## 2011-10-03 LAB — HEPATIC FUNCTION PANEL
ALT: 15 U/L (ref 0–35)
AST: 17 U/L (ref 0–37)
Albumin: 4 g/dL (ref 3.5–5.2)
Alkaline Phosphatase: 34 U/L — ABNORMAL LOW (ref 39–117)
Total Protein: 7.3 g/dL (ref 6.0–8.3)

## 2011-10-03 LAB — HIV ANTIBODY (ROUTINE TESTING W REFLEX): HIV: NONREACTIVE

## 2011-10-03 LAB — TSH: TSH: 3.2 u[IU]/mL (ref 0.35–5.50)

## 2011-10-03 LAB — LDL CHOLESTEROL, DIRECT: Direct LDL: 136.4 mg/dL

## 2011-10-03 MED ORDER — DICLOFENAC SODIUM 75 MG PO TBEC: 75.0000 mg | DELAYED_RELEASE_TABLET | Freq: Two times a day (BID) | ORAL | Status: DC

## 2011-10-03 NOTE — Patient Instructions (Addendum)
It was good to see you today. Test(s) ordered today. Your results will be called to you after review (48-72hours after test completion). If any changes need to be made, you will be notified at that time. We'll check screening for STDs as requested Health Maintenance reviewed - patient asked to schedule her pap smear, otherwise all recommendations up-to-date.  Will refer for ultrasound of your neck, also nerve conduction study to evaluate left arm numbness -  Our office will contact you regarding appointment(s) once made. Use Voltaren twice a day with food for the next 2 weeks for tendinitis symptoms as discussed - Your prescription(s) have been submitted to your pharmacy. Please take as directed and contact our office if you believe you are having problem(s) with the medication(s). Avoid ibuprofen while using Voltaren Please schedule followup in 3-4 months to continue reviewing these and other medical issues, call sooner if problems.

## 2011-10-03 NOTE — Progress Notes (Signed)
Subjective:    Patient ID: [name], female    DOB: Jan 10, 1961, 50 y.o.   MRN: [id number]  HPI  patient is here today for annual physical. Patient feels well overall. Requests testing for STDs due to boyfriend cheating.  Also reviewed chronic med issues: allergic rhinitis - not using claritin due to cost - allergy symptoms complicating usual headache symptoms - see next   chronic headache  - daily - ongoing > 2 years   no change in nature of pain or symptoms since onset not improved with OTC meds - ibuprofen no fever, no change in headache location or intenstiy located over left temporal region, then spreads to right no weakness, no vision change - no head trauma or injury, falls Denies prior neurology evaluation   hypertriglyceridemia - never on medical treatment for same - tries to follow low fat diet and exercise   history of hypertension- prev on prescribed medical therapy but none >3 years - no chest pain or edema -     GERD - prev on nexium and zantac -   no abdominal pain nausea or vomiting no history of peptic ulcer or BRBPR not using OTC meds due to ineffective relief of symptoms on same - on omeprazole starting 09/2010 - improved     Past Medical History  Diagnosis Date  . LIPOMA     R shoulder s/p excision 12/2010  . HYPERLIPIDEMIA     TGs, never on med rx  . ANEMIA-NOS   . HYPERTENSION   . ALLERGIC RHINITIS   . GERD   . Headache    Family History  Problem Relation Age of Onset  . Arthritis Other     grandparents  . Breast cancer Other     other relative  . Diabetes Other     grandparents  . Hyperlipidemia Other     grandparents  . Hypertension Other     grandparent & parents   History  Substance Use Topics  . Smoking status: Never Smoker   . Smokeless tobacco: Not on file   Comment: Divorced, lives alone - has boyfriend and remains "best friends" with exhusband  . Alcohol Use: No     Review of Systems Constitutional: Negative for fever;  positive for unexpected weight gain.  Respiratory: Negative for cough and shortness of breath.   Cardiovascular: Negative for chest pain or palpitations.  Gastrointestinal: Negative for abdominal pain, no bowel changes.  Musculoskeletal: Negative for gait problem or joint swelling.  left side knee and ankle pain x2 months, denies precipitating injury Skin: Negative for rash or bruising.  Neurological: Negative for dizziness; positive for chronic headache, also paroxysmal numbness in right upper extremity and hands/fingers in median distribution.  No other specific complaints in a complete review of systems (except as listed in HPI above).     Objective:   Physical Exam BP 122/90  Pulse 82  Temp(Src) 98.3 F (36.8 C) (Oral)  Wt 187 lb 9.6 oz (85.095 kg)  SpO2 99% Wt Readings from Last 3 Encounters:  10/03/11 187 lb 9.6 oz (85.095 kg)  07/29/11 186 lb 6.4 oz (84.55 kg)  12/22/10 175 lb (79.379 kg)   Constitutional: She appears well-developed and well-nourished. No distress.  HENT: Head: Normocephalic and atraumatic. Ears: B TMs ok, no erythema or effusion; Nose: Nose normal.  Mouth/Throat: Oropharynx is clear and moist. No oropharyngeal exudate.  Eyes: Conjunctivae and EOM are normal. Pupils are equal, round, and reactive to light. No scleral icterus.  Neck:  Chronic nodular swelling on the right side of neck, soft, mobile and nontender. Approximately 2 cm round diameter. Normal range of motion. Neck supple. No JVD present. No thyromegaly present.  Cardiovascular: Normal rate, regular rhythm and normal heart sounds.  No murmur heard. No BLE edema. Pulmonary/Chest: Effort normal and breath sounds normal. No respiratory distress. She has no wheezes.  Abdominal: Soft. Bowel sounds are normal. She exhibits no distension. There is no tenderness. no masses Musculoskeletal: Left knee: Normal range of motion, no joint effusions. No gross deformities - ligamentous function intact and nontender to  palpation. Left ankle with no gross deformities, no swelling; full range of motion and ligamentous function intact -nontender - also full range of motion left shoulder without impingement or reproducible numbness -  Neurological: She is alert and oriented to person, place, and time. No cranial nerve deficit. Coordination normal.  Skin: Skin is warm and dry. No rash noted. No erythema.  Psychiatric: She has an anxious mood and affect. Her behavior is normal. Judgment and thought content normal.   Lab Results  Component Value Date   WBC 5.4 07/29/2011   HGB 12.6 07/29/2011   HCT 36.7 07/29/2011   PLT 208 07/29/2011   GLUCOSE 79 09/21/2010   CHOL 202* 09/21/2010   TRIG 150.0* 09/21/2010   HDL 36.50* 09/21/2010   LDLDIRECT 129.8 09/21/2010   LDLCALC 141* 09/11/2009   ALT 19 09/21/2010   AST 18 09/21/2010   NA 135 09/21/2010   K 4.4 09/21/2010   CL 102 09/21/2010   CREATININE 1.0 09/21/2010   BUN 16 09/21/2010   CO2 25 09/21/2010   TSH 2.28 09/21/2010   EKG - sinus at 68 beats per minute. Negative precordial T waves without change. No acute ischemic changes or arrhythmia     Assessment & Plan:  CPX - v70.0 - Patient has been counseled on age-appropriate routine health concerns for screening and prevention. These are reviewed and up-to-date. Immunizations are up-to-date or declined. Labs ordered and ECG reviewed.  Left knee and ankle pain - exam unremarkable. No injury. Suspect tendinitis. Voltaren trial prescribed in place of over-the-counter ibuprofen - to consider physical therapy, declines at this time  Paroxysmal left upper extremity numbness - suspect carpal tunnel versus neck impingement. No evidence of cardiac abnormality and reassurance regarding same provided. Arrange nerve conduction studies to further evaluate same  Right neck swelling, question goiter. Chronic per patient but no ultrasound in many years. Will arrange for ultrasound to evaluate and monitor stability of  same  STD exposure. Check screening labs at patient request today

## 2011-10-04 ENCOUNTER — Other Ambulatory Visit: Payer: Self-pay | Admitting: *Deleted

## 2011-10-04 NOTE — Telephone Encounter (Signed)
Called pt concerning lab results, pt want to know will md rx a diet pill for her. Since labs came back ok....10/04/11@2 :08pm/LMB

## 2011-10-05 MED ORDER — PHENTERMINE HCL 37.5 MG PO CAPS: 37.5000 mg | ORAL_CAPSULE | ORAL | Status: DC

## 2011-10-05 NOTE — Telephone Encounter (Signed)
Can try 30d phenteremine - rx done for patient to pick up at her convenience- please note no more than 3 months total during each year will be prescribed by me for this medication and she will need to come in for a nurse visit weight check to prove weight loss success while on the medication before additional refills will be provided. Thanks

## 2011-10-05 NOTE — Telephone Encounter (Signed)
Notified pt with md response. Left rx up front for pick-up along with copy of labs per patient request....10/05/11@10 :05am/LMB

## 2011-10-17 ENCOUNTER — Other Ambulatory Visit: Payer: Self-pay

## 2011-10-17 MED ORDER — MELOXICAM 15 MG PO TABS: 15.0000 mg | ORAL_TABLET | Freq: Every day | ORAL | Status: DC

## 2011-10-17 NOTE — Telephone Encounter (Signed)
Her symptoms are not typical side effects of diclofenac. Nonetheless stop diclofenac and try once daily meloxicam. New prescription electronically submitted

## 2011-10-17 NOTE — Telephone Encounter (Signed)
Pt called stating she has been having SE from Diclofenac; severe fatigue, HA and Muscle pains. Pt is requesting alternative treatment, please advise.

## 2011-10-18 NOTE — Telephone Encounter (Signed)
Pt return call back gave md response. rx already sent to pharmacy...10/18/11@1 :34pm/LMB

## 2011-10-18 NOTE — Telephone Encounter (Signed)
Called pt no answer LMOm RTc...10/18/11@1 :28pm/LMB

## 2011-10-21 ENCOUNTER — Other Ambulatory Visit: Payer: BC Managed Care – PPO

## 2011-10-21 ENCOUNTER — Ambulatory Visit
Admission: RE | Admit: 2011-10-21 | Discharge: 2011-10-21 | Disposition: A | Discharge status: Home / Self Care | Payer: BC Managed Care – PPO | Source: Ambulatory Visit | Attending: Internal Medicine | Admitting: Internal Medicine

## 2011-10-21 DIAGNOSIS — Z1231 Encounter for screening mammogram for malignant neoplasm of breast: Secondary | ICD-10-CM

## 2011-11-15 ENCOUNTER — Telehealth: Payer: Self-pay | Admitting: Internal Medicine

## 2011-11-15 DIAGNOSIS — E041 Nontoxic single thyroid nodule: Secondary | ICD-10-CM

## 2011-11-15 NOTE — Telephone Encounter (Signed)
Change to [facility] location

## 2011-11-15 NOTE — Telephone Encounter (Signed)
The pt called and wanted to know if she can get her thyroid tested at the hospital?  Please advise   Thanks!!

## 2011-11-15 NOTE — Telephone Encounter (Signed)
Called pt back to verify what she was needing. Pt states md referred her to have a U/S done of her thyroid @ [facility]. She was contacted by there office letting her know she had u/s set-up, but there would be a co-pay of $250 up front. Pt is requesting u/s to be done at either [place] or [place] which she don't have to pay a copay....11/15/11@4 :50pm/LMB

## 2011-11-16 NOTE — Telephone Encounter (Signed)
Pt notified will get call back by [facility] with appt, date & times...11/16/11@8 :38am/LMB

## 2011-11-18 ENCOUNTER — Other Ambulatory Visit (HOSPITAL_COMMUNITY): Payer: BC Managed Care – PPO

## 2011-11-21 ENCOUNTER — Ambulatory Visit (HOSPITAL_COMMUNITY)
Admission: RE | Admit: 2011-11-21 | Discharge: 2011-11-21 | Disposition: A | Discharge status: Home / Self Care | Payer: BC Managed Care – PPO | Source: Ambulatory Visit | Attending: Internal Medicine | Admitting: Internal Medicine

## 2011-11-21 DIAGNOSIS — E042 Nontoxic multinodular goiter: Secondary | ICD-10-CM | POA: Insufficient documentation

## 2011-11-21 DIAGNOSIS — E041 Nontoxic single thyroid nodule: Secondary | ICD-10-CM

## 2011-12-02 ENCOUNTER — Ambulatory Visit (INDEPENDENT_AMBULATORY_CARE_PROVIDER_SITE_OTHER): Payer: BC Managed Care – PPO | Admitting: Internal Medicine

## 2011-12-02 ENCOUNTER — Other Ambulatory Visit: Payer: BC Managed Care – PPO

## 2011-12-02 ENCOUNTER — Encounter: Payer: Self-pay | Admitting: Internal Medicine

## 2011-12-02 ENCOUNTER — Ambulatory Visit (INDEPENDENT_AMBULATORY_CARE_PROVIDER_SITE_OTHER)
Admission: RE | Admit: 2011-12-02 | Discharge: 2011-12-02 | Disposition: A | Discharge status: Home / Self Care | Payer: BC Managed Care – PPO | Source: Ambulatory Visit | Attending: Internal Medicine | Admitting: Internal Medicine

## 2011-12-02 VITALS — BP 132/100 | HR 98 | Temp 97.5°F | Wt 188.0 lb

## 2011-12-02 DIAGNOSIS — R091 Pleurisy: Secondary | ICD-10-CM

## 2011-12-02 DIAGNOSIS — K219 Gastro-esophageal reflux disease without esophagitis: Secondary | ICD-10-CM

## 2011-12-02 DIAGNOSIS — R0609 Other forms of dyspnea: Secondary | ICD-10-CM

## 2011-12-02 DIAGNOSIS — E041 Nontoxic single thyroid nodule: Secondary | ICD-10-CM

## 2011-12-02 DIAGNOSIS — R0989 Other specified symptoms and signs involving the circulatory and respiratory systems: Secondary | ICD-10-CM

## 2011-12-02 MED ORDER — TRAZODONE HCL 50 MG PO TABS: 25.0000 mg | ORAL_TABLET | Freq: Every evening | ORAL | Status: AC | PRN

## 2011-12-02 MED ORDER — PANTOPRAZOLE SODIUM 40 MG PO TBEC: 40.0000 mg | DELAYED_RELEASE_TABLET | Freq: Every day | ORAL | Status: DC

## 2011-12-02 NOTE — Progress Notes (Signed)
  Subjective:    Patient ID: [name], female    DOB: May 28, 1961, 51 y.o.   MRN: [id number]  HPI   Here for follow up thyroid US result -   Also insomnia - Ambien ineffective - exac by stress - boyfriend cheating - prior elavil ineffective   GERD - prev on nexium, aciphex and zantac -   no abdominal pain nausea or vomiting no history of peptic ulcer or BRBPR Increasing backwash and sour stomach Changed to omeprazole starting 09/2010 due to same - initially improved     Past Medical History  Diagnosis Date  . LIPOMA     R shoulder s/p excision 12/2010  . HYPERLIPIDEMIA     TGs, never on med rx  . ANEMIA-NOS   . HYPERTENSION   . ALLERGIC RHINITIS   . GERD   . Headache      Review of Systems  Constitutional: Negative for fever; positive for unexpected weight gain.  Respiratory: Positive for dry cough and shortness of breath x2 weeks.   Cardiovascular: Negative for leg swelling or palpitations.      Objective:   Physical Exam  BP 132/100  Pulse 98  Temp(Src) 97.5 F (36.4 C) (Oral)  Wt 188 lb (85.276 kg)  SpO2 94% Wt Readings from Last 3 Encounters:  12/02/11 188 lb (85.276 kg)  10/03/11 187 lb 9.6 oz (85.095 kg)  07/29/11 186 lb 6.4 oz (84.55 kg)   Constitutional: She appears well-developed and well-nourished. No distress.  Eyes: Conjunctivae and EOM are normal. Pupils are equal, round, and reactive to light. No scleral icterus.  Neck: Chronic nodular swelling on the right side of neck, soft, mobile and nontender. Approximately 2 cm round diameter. Normal range of motion. Neck supple. No JVD present. No thyromegaly present.  Cardiovascular: Normal rate, regular rhythm and normal heart sounds.  No murmur heard. No BLE edema. Pulmonary/Chest: Effort normal and breath sounds normal. No respiratory distress. She has no wheezes.  Psychiatric: She has an anxious mood and affect. Her behavior is normal. Judgment and thought content normal.   Lab Results  Component  Value Date   WBC 6.4 10/03/2011   HGB 12.7 10/03/2011   HCT 37.3 10/03/2011   PLT 257.0 10/03/2011   GLUCOSE 87 10/03/2011   CHOL 165 10/03/2011   TRIG 250.0* 10/03/2011   HDL 41.40 10/03/2011   LDLDIRECT 136.4 10/03/2011   LDLCALC 141* 09/11/2009   ALT 15 10/03/2011   AST 17 10/03/2011   NA 141 10/03/2011   K 4.5 10/03/2011   CL 110 10/03/2011   CREATININE 0.9 10/03/2011   BUN 18 10/03/2011   CO2 22 10/03/2011   TSH 3.20 10/03/2011       Assessment & Plan:  Insomnia, chronic. Exacerbated by stress. On nightly Ambien without relief. Add trazodone. Discussed stress relief techniques and healthy sleep hygiene. Patient declines medication for treatment of underlying anxiety  Left knee pain - exam with patellar tendinitis. Meloxicam prescribed in place of over-the-counter ibuprofen the patient has not yet begun same, encourage compliance. Also recommended to consider physical therapy, declines at this time  Chronic right neck swelling, question goiter. Thyroid ultrasound 2010 and 11/2011 reviewed> nonsp tiny nodules - will follow annually - reviewed resutls and expectant mgmt for same in depth today   Dyspnea. Substernal pleurisy and chest pain x2 weeks. O2 sats 94%, previously 99. Check d-dimer and chest x-ray. Lung exam is clear

## 2011-12-02 NOTE — Patient Instructions (Addendum)
It was good to see you today. Reviewed your ultrasound today -will check another thyroid ultrasound in one year Chest x-ray and blood test today for your shortness of breath. We will call you with results once available, expect 48-72 hours after test complete Change omeprazole to protonix or other acid medication as approved by her insurance Change Ambien to trazodone for sleep -  Your prescription(s) have been submitted to your pharmacy. Please take as directed and contact our office if you believe you are having problem(s) with the medication(s).

## 2011-12-02 NOTE — Assessment & Plan Note (Signed)
Prior treatment with Nexium, AcipHex, omeprazole and Zantac ineffective Change to Protonix now

## 2012-03-19 ENCOUNTER — Other Ambulatory Visit: Payer: Self-pay | Admitting: Internal Medicine

## 2012-03-20 NOTE — Telephone Encounter (Signed)
Faxed ambien script back to [facility]... 03/20/12@10 :58am/LMB

## 2012-06-06 ENCOUNTER — Encounter: Payer: Self-pay | Admitting: Internal Medicine

## 2012-06-06 ENCOUNTER — Other Ambulatory Visit (INDEPENDENT_AMBULATORY_CARE_PROVIDER_SITE_OTHER): Payer: BC Managed Care – PPO

## 2012-06-06 ENCOUNTER — Ambulatory Visit (INDEPENDENT_AMBULATORY_CARE_PROVIDER_SITE_OTHER): Payer: BC Managed Care – PPO | Admitting: Internal Medicine

## 2012-06-06 VITALS — BP 122/82 | HR 98 | Temp 98.1°F | Ht 68.0 in | Wt 182.4 lb

## 2012-06-06 DIAGNOSIS — N6452 Nipple discharge: Secondary | ICD-10-CM

## 2012-06-06 DIAGNOSIS — M545 Low back pain, unspecified: Secondary | ICD-10-CM

## 2012-06-06 DIAGNOSIS — N6459 Other signs and symptoms in breast: Secondary | ICD-10-CM

## 2012-06-06 DIAGNOSIS — R300 Dysuria: Secondary | ICD-10-CM

## 2012-06-06 DIAGNOSIS — Z113 Encounter for screening for infections with a predominantly sexual mode of transmission: Secondary | ICD-10-CM

## 2012-06-06 LAB — CBC WITH DIFFERENTIAL/PLATELET
Basophils Relative: 0.5 % (ref 0.0–3.0)
Eosinophils Relative: 3.1 % (ref 0.0–5.0)
HCT: 34 % — ABNORMAL LOW (ref 36.0–46.0)
Hemoglobin: 11.6 g/dL — ABNORMAL LOW (ref 12.0–15.0)
Lymphs Abs: 1.3 10*3/uL (ref 0.7–4.0)
MCV: 82.1 fl (ref 78.0–100.0)
Monocytes Absolute: 0.4 10*3/uL (ref 0.1–1.0)
Monocytes Relative: 8.5 % (ref 3.0–12.0)
Neutro Abs: 2.9 10*3/uL (ref 1.4–7.7)
Platelets: 239 10*3/uL (ref 150.0–400.0)
WBC: 4.8 10*3/uL (ref 4.5–10.5)

## 2012-06-06 LAB — URINALYSIS, ROUTINE W REFLEX MICROSCOPIC
Bilirubin Urine: NEGATIVE
Ketones, ur: NEGATIVE
Leukocytes, UA: NEGATIVE
Urine Glucose: NEGATIVE
Urobilinogen, UA: 0.2 (ref 0.0–1.0)

## 2012-06-06 LAB — BASIC METABOLIC PANEL
BUN: 14 mg/dL (ref 6–23)
Chloride: 106 mEq/L (ref 96–112)
Glucose, Bld: 92 mg/dL (ref 70–99)
Potassium: 3.9 mEq/L (ref 3.5–5.1)
Sodium: 139 mEq/L (ref 135–145)

## 2012-06-06 LAB — TSH: TSH: 1.61 u[IU]/mL (ref 0.35–5.50)

## 2012-06-06 MED ORDER — CIPROFLOXACIN HCL 500 MG PO TABS: 500.0000 mg | ORAL_TABLET | Freq: Two times a day (BID) | ORAL | Status: AC

## 2012-06-06 NOTE — Patient Instructions (Signed)
It was good to see you today. Test(s) ordered today. Your results will be called to you after review (48-72hours after test completion). If any changes need to be made, you will be notified at that time. we'll make referral for mammogram . Our office will contact you regarding appointment(s) once made. Start Cipro antibiotics for probable bladder infection causing pain If labs normal, will write prescription for phentermine and discussed

## 2012-06-06 NOTE — Progress Notes (Signed)
Subjective:    Patient ID: [name], female    DOB: 17-Nov-1960, 51 y.o.   MRN: [id number]  HPI See CC and ROS  Breast discharge is from Both breasts and clear  Denies new meds or trauma Onset 1-2 months ago   abdominal pain is BLQ and radiates to lower back +dysuria and frequency - ?UTI or STD  wants phentermine rx   Past Medical History  Diagnosis Date  . LIPOMA     R shoulder s/p excision 12/2010  . HYPERLIPIDEMIA     TGs, never on med rx  . ANEMIA-NOS   . HYPERTENSION   . ALLERGIC RHINITIS   . GERD   . Headache      Review of Systems  Constitutional: Positive for unexpected weight change. Negative for fever and fatigue.  Gastrointestinal: Positive for abdominal pain. Negative for vomiting, diarrhea and constipation.  Genitourinary: Positive for dysuria, frequency and flank pain.  Neurological: Positive for headaches.  Constitutional: Negative for fever or unexpected weight change  Cardiovascular: Negative for leg swelling or palpitations.      Objective:   Physical Exam  BP 122/82  Pulse 98  Temp 98.1 F (36.7 C) (Oral)  Ht 5\' 8"  (1.727 m)  Wt 182 lb 6.4 oz (82.736 kg)  BMI 27.73 kg/m2  SpO2 98% Wt Readings from Last 3 Encounters:  06/06/12 182 lb 6.4 oz (82.736 kg)  12/02/11 188 lb (85.276 kg)  10/03/11 187 lb 9.6 oz (85.095 kg)   Constitutional: She appears well-developed and well-nourished. No distress.  Eyes: Conjunctivae and EOM are normal. Pupils are equal, round, and reactive to light. No scleral icterus.  Neck: Chronic nodular swelling on the right side of neck, soft, mobile and nontender. Approximately 2 cm round diameter. Normal range of motion. Neck supple. No JVD present. No thyromegaly present.  Breasts not examined by me today Cardiovascular: Normal rate, regular rhythm and normal heart sounds.  No murmur heard. No BLE edema. Pulmonary/Chest: Effort normal and breath sounds normal. No respiratory distress. She has no wheezes.  Abd:  SNTND+BS GU: defer MSkel: Back: full range of motion of thoracic and lumbar spine. Non tender to palpation. Negative straight leg raise. DTR's are symmetrically intact. Sensation intact in all dermatomes of the lower extremities. Full strength to manual muscle testing. patient is able to heel toe walk without difficulty and ambulates with antalgic gait.  Neuro: AAOx4, CN2-12 symmetrically intact, MAE, good F>N, normal gait and coordination Psychiatric: She has an anxious mood and affect. Her behavior is normal. Judgment and thought content normal.   Lab Results  Component Value Date   WBC 6.4 10/03/2011   HGB 12.7 10/03/2011   HCT 37.3 10/03/2011   PLT 257.0 10/03/2011   GLUCOSE 87 10/03/2011   CHOL 165 10/03/2011   TRIG 250.0* 10/03/2011   HDL 41.40 10/03/2011   LDLDIRECT 136.4 10/03/2011   LDLCALC 141* 09/11/2009   ALT 15 10/03/2011   AST 17 10/03/2011   NA 141 10/03/2011   K 4.5 10/03/2011   CL 110 10/03/2011   CREATININE 0.9 10/03/2011   BUN 18 10/03/2011   CO2 22 10/03/2011   TSH 3.20 10/03/2011       Assessment & Plan:  Breast discharge - check labs and mammo, no offending meds by hx  High risk for STD, hx exposure to same - upcoming wedding and requests recheck screening  Lumbago with dysuria - suspect strain vs UTI/PN, abdominal exam benign -  check labs and start empiric  cipro rec ibuprofen or tylenol prn  Headache - tension - neuro exam benign - The patient is reassured that these symptoms do not appear to represent a serious or threatening condition.

## 2012-06-07 ENCOUNTER — Telehealth: Payer: Self-pay | Admitting: *Deleted

## 2012-06-07 DIAGNOSIS — N6452 Nipple discharge: Secondary | ICD-10-CM

## 2012-06-07 MED ORDER — PHENTERMINE HCL 37.5 MG PO CAPS: 37.5000 mg | ORAL_CAPSULE | ORAL | Status: DC

## 2012-06-07 NOTE — Telephone Encounter (Signed)
New order for dx mammo done

## 2012-06-07 NOTE — Telephone Encounter (Signed)
[facility] regarding order for pt's mammogram. Dx is for breast discharge-if pt is having problem, then order needs to be changed to Diagnostic Bilateral Mammogram. If pt is just having screening mammogram, then dx code needs to be changed to V76.12.

## 2012-06-07 NOTE — Addendum Note (Signed)
Addended by: Rene Paci A on: 06/07/2012 08:52 AM   Modules accepted: Orders

## 2012-06-12 ENCOUNTER — Telehealth: Payer: Self-pay | Admitting: *Deleted

## 2012-06-12 NOTE — Telephone Encounter (Signed)
Left msg on vm needing to get PA on her phentermine...06/12/12@2 :09pm/LMB

## 2012-06-12 NOTE — Telephone Encounter (Signed)
Called pt back to get more information. She states she already picked rx up but if she can get reimburse. Inform pt will need insurance # along with member ID. She states will get info and call us back... 06/12/12@2 :14pm/LMB

## 2012-06-12 NOTE — Telephone Encounter (Signed)
Pt call back she states [facility] will be faxing over PA for phentermine... 06/12/12@3 :33pm/LMB

## 2012-06-14 ENCOUNTER — Telehealth: Payer: Self-pay

## 2012-06-14 NOTE — Telephone Encounter (Signed)
Pt called requesting status of Phentermine PA

## 2012-06-14 NOTE — Telephone Encounter (Signed)
Spoke with pt on tues pharmacy suppose to have ben faxing PA still not received. Contacted [facility] spoke with Baxter Hire suppose to be faxing over PA to [phone]. Notified pt with info...06/14/12@12 :36pm/LMB

## 2012-06-15 NOTE — Telephone Encounter (Signed)
Called pt this am and inform her we still haven't received PA fax from [facility]. Pt states she will go up there and get the information, and call me back... 06/15/12@10 :22am/LMB

## 2012-06-18 NOTE — Telephone Encounter (Signed)
Will close this encounter pending PA

## 2012-06-22 ENCOUNTER — Telehealth: Payer: Self-pay | Admitting: *Deleted

## 2012-06-22 NOTE — Telephone Encounter (Signed)
Contacted Pt pharmacy and was told that Pt pd for Phentermine 37.5mg  out of pocket. Contacted Pt insurance and was sent PA form for Phentermine 37.5mg . Paperwork has been forwarded to Dr. Felicity Coyer. Pt notified that paperwork has been started.

## 2012-06-22 NOTE — Telephone Encounter (Signed)
Patient called re requested Pa for Phentermine. I contacted her pharmacy and obtained the prior auth request . Lorene Dy ,CMA to initiate and fu with the patient today. Called patient and she is aware of the above.

## 2012-06-25 NOTE — Telephone Encounter (Signed)
Completed Pa fax back to insurance waiting on approval status.Marland KitchenMarland Kitchen8/19/13@4 :54pm/LMB

## 2012-06-26 NOTE — Telephone Encounter (Signed)
Received PA back med hs been approved. Notified pharmacy spoke with [name] gave approval status. She states pt came by yesterday and was reimburse already. Called pt to inform her PA approved. She states she would like to get reimburse for the 1st phentermine rx that was given bck in Jan or Feb she paid $ 35....06/26/12@10 :26am/LMB

## 2012-06-27 NOTE — Telephone Encounter (Signed)
Spoke with [name] Emergency planning/management officer) this am about pt wanting to get reimburse fro rx that was given back in Jan. Per [name] pt will need to [employer] herself and submit claim. Called pt back this am & inform her she will have to contact insurance to try and get reimburse... 06/27/12@10 :34am/LMB

## 2012-07-15 ENCOUNTER — Emergency Department (HOSPITAL_COMMUNITY)
Admission: EM | Admit: 2012-07-15 | Discharge: 2012-07-15 | Disposition: A | Discharge status: Home / Self Care | Payer: BC Managed Care – PPO | Attending: Emergency Medicine | Admitting: Emergency Medicine

## 2012-07-15 ENCOUNTER — Encounter (HOSPITAL_COMMUNITY): Payer: Self-pay | Admitting: Emergency Medicine

## 2012-07-15 ENCOUNTER — Emergency Department (HOSPITAL_COMMUNITY): Payer: BC Managed Care – PPO

## 2012-07-15 DIAGNOSIS — W2209XA Striking against other stationary object, initial encounter: Secondary | ICD-10-CM | POA: Insufficient documentation

## 2012-07-15 DIAGNOSIS — I10 Essential (primary) hypertension: Secondary | ICD-10-CM | POA: Insufficient documentation

## 2012-07-15 DIAGNOSIS — K219 Gastro-esophageal reflux disease without esophagitis: Secondary | ICD-10-CM | POA: Insufficient documentation

## 2012-07-15 DIAGNOSIS — S62339A Displaced fracture of neck of unspecified metacarpal bone, initial encounter for closed fracture: Secondary | ICD-10-CM

## 2012-07-15 DIAGNOSIS — Z79899 Other long term (current) drug therapy: Secondary | ICD-10-CM | POA: Insufficient documentation

## 2012-07-15 DIAGNOSIS — E785 Hyperlipidemia, unspecified: Secondary | ICD-10-CM | POA: Insufficient documentation

## 2012-07-15 DIAGNOSIS — S62309A Unspecified fracture of unspecified metacarpal bone, initial encounter for closed fracture: Secondary | ICD-10-CM | POA: Insufficient documentation

## 2012-07-15 MED ORDER — HYDROCODONE-ACETAMINOPHEN 5-325 MG PO TABS: 1.0000 | ORAL_TABLET | ORAL | Status: AC | PRN

## 2012-07-15 MED ORDER — HYDROCODONE-ACETAMINOPHEN 5-325 MG PO TABS
1.0000 | ORAL_TABLET | Freq: Once | ORAL | Status: AC
  Administered 2012-07-15: 1 via ORAL
  Filled 2012-07-15: qty 1

## 2012-07-15 NOTE — ED Provider Notes (Signed)
History     CSN: [id number]  Arrival date & time 07/15/12  1259   First MD Initiated Contact with Patient 07/15/12 1443      Chief Complaint  Patient presents with  . right hand injury     (Consider location/radiation/quality/duration/timing/severity/associated sxs/prior treatment) HPI Comments: Patient reports she was "playing around" 5-6 days ago and punched a cabinet.  Reports persistent pain and swelling in her right hand.  Denies weakness or numbness of her hand or fingers.  Denies wrist pain.  Denies any other injury.  No head injury or LOC while "playing around."    The history is provided by the patient.    Past Medical History  Diagnosis Date  . LIPOMA     R shoulder s/p excision 12/2010  . HYPERLIPIDEMIA     TGs, never on med rx  . ANEMIA-NOS   . HYPERTENSION   . ALLERGIC RHINITIS   . GERD   . Headache     Past Surgical History  Procedure Date  . Abdominal hysterectomy 2007  . (r) shoulder lipoma excision 12/2010    [facility]    Family History  Problem Relation Age of Onset  . Arthritis Other     grandparents  . Breast cancer Other     other relative  . Diabetes Other     grandparents  . Hyperlipidemia Other     grandparents  . Hypertension Other     grandparent & parents    History  Substance Use Topics  . Smoking status: Never Smoker   . Smokeless tobacco: Not on file   Comment: Divorced, lives alone - has boyfriend and remains "best friends" with exhusband  . Alcohol Use: No    OB History    Grav Para Term Preterm Abortions TAB SAB Ect Mult Living   2 2 2       2       Review of Systems  Skin: Negative for color change and pallor.  Neurological: Negative for syncope, weakness, numbness and headaches.    Allergies  Imodium; Penicillins; Doxycycline; and Percocet  Home Medications   Current Outpatient Rx  Name Route Sig Dispense Refill  . DIPHENHYDRAMINE HCL 25 MG PO CAPS Oral Take 25 mg by mouth every 6 (six) hours as needed.  Patient takes for allergies    . MELOXICAM 15 MG PO TABS Oral Take 15 mg by mouth daily.    Marland Kitchen OMEPRAZOLE 20 MG PO CPDR Oral Take 20 mg by mouth daily as needed. For heartburn    . ZOLPIDEM TARTRATE 10 MG PO TABS Oral Take 10 mg by mouth at bedtime as needed. For sleep    . HYDROCODONE-ACETAMINOPHEN 5-325 MG PO TABS Oral Take 1 tablet by mouth every 4 (four) hours as needed for pain. 20 tablet 0  . PHENTERMINE HCL 37.5 MG PO CAPS Oral Take 1 capsule (37.5 mg total) by mouth every morning. 30 capsule 0  . PHENTERMINE HCL 37.5 MG PO CAPS Oral Take 1 capsule (37.5 mg total) by mouth every morning. 30 capsule 0    BP 143/79  Pulse 81  Temp 98.1 F (36.7 C) (Oral)  Resp 18  SpO2 100%  Physical Exam  Nursing note and vitals reviewed. Constitutional: She appears well-developed and well-nourished. No distress.  HENT:  Head: Normocephalic and atraumatic.  Neck: Neck supple.  Pulmonary/Chest: Effort normal.  Musculoskeletal:       Right wrist: Normal.       Right hand: She exhibits tenderness,  bony tenderness and swelling. She exhibits normal range of motion and normal capillary refill. normal sensation noted. Normal strength noted.       Hands:      Right radial pulse is intact.    Neurological: She is alert.  Skin: She is not diaphoretic.    ED Course  Procedures (including critical care time)  Labs Reviewed - No data to display Dg Hand Complete Right  07/15/2012  *RADIOLOGY REPORT*  Clinical Data: Right hand pain status post punching injury 6 days ago  RIGHT HAND - COMPLETE 3+ VIEW  Comparison: None.  Findings: Mildly displaced fracture through the base of the fifth metacarpal with apex dorsal angulation of the fracture site.  The remainder of the visualized bones and joints are unremarkable. There is associated soft tissue swelling over the hypothenar eminence and dorsum of the medial hand.  IMPRESSION:  Mildly displaced fracture through the base of the fifth metacarpal with apex dorsal  angulation of the fracture site.   Original Report Authenticated By: HEATH      1. Boxer's fracture       MDM  Pt with boxer's fracture sustained 5-6 days ago.  Neurovascularly intact.  Placed in ulnar gutter splint by ortho tech, d/c home with hand follow up, norco.  Discussed all results with patient.  Pt given return precautions.  Pt verbalizes understanding and agrees with plan.           [place], [place] 07/15/12 [phone]

## 2012-07-15 NOTE — ED Notes (Signed)
Hit something on Tuesday, hand is swollen and c/o pain

## 2012-07-17 NOTE — ED Provider Notes (Signed)
Medical screening examination/treatment/procedure(s) were performed by non-physician practitioner and as supervising physician I was immediately available for consultation/collaboration.  Frederik Standley R. Aristea Posada, MD 07/17/12 2008 

## 2012-07-18 ENCOUNTER — Other Ambulatory Visit: Payer: Self-pay

## 2012-07-19 MED ORDER — PHENTERMINE HCL 37.5 MG PO CAPS: 37.5000 mg | ORAL_CAPSULE | ORAL | Status: DC

## 2012-07-19 NOTE — Telephone Encounter (Signed)
Pt informed, Rx in cabinet for pt pick up  

## 2012-09-11 ENCOUNTER — Other Ambulatory Visit: Payer: Self-pay | Admitting: Internal Medicine

## 2012-09-11 MED ORDER — PHENTERMINE HCL 37.5 MG PO CAPS: 37.5000 mg | ORAL_CAPSULE | ORAL | Status: DC

## 2012-09-11 NOTE — Telephone Encounter (Signed)
Notified pt rx ready for pick-up.../lmb 

## 2012-09-11 NOTE — Telephone Encounter (Signed)
Pt call requesting refill on her phentermine. Is this ok...Raechel Chute

## 2012-09-18 IMAGING — MG MM DIGITAL SCREENING
4 series · 4 of 4 positions shown · non-contrast
Comparison: none

DG SCREEN MAMMOGRAM BILATERAL
Bilateral CC and MLO view(s) were taken.

DIGITAL SCREENING MAMMOGRAM WITH CAD:
There are scattered fibroglandular densities.  No masses or malignant type calcifications are 
identified.  Compared with prior studies.
Images were processed with CAD.

[R CC]
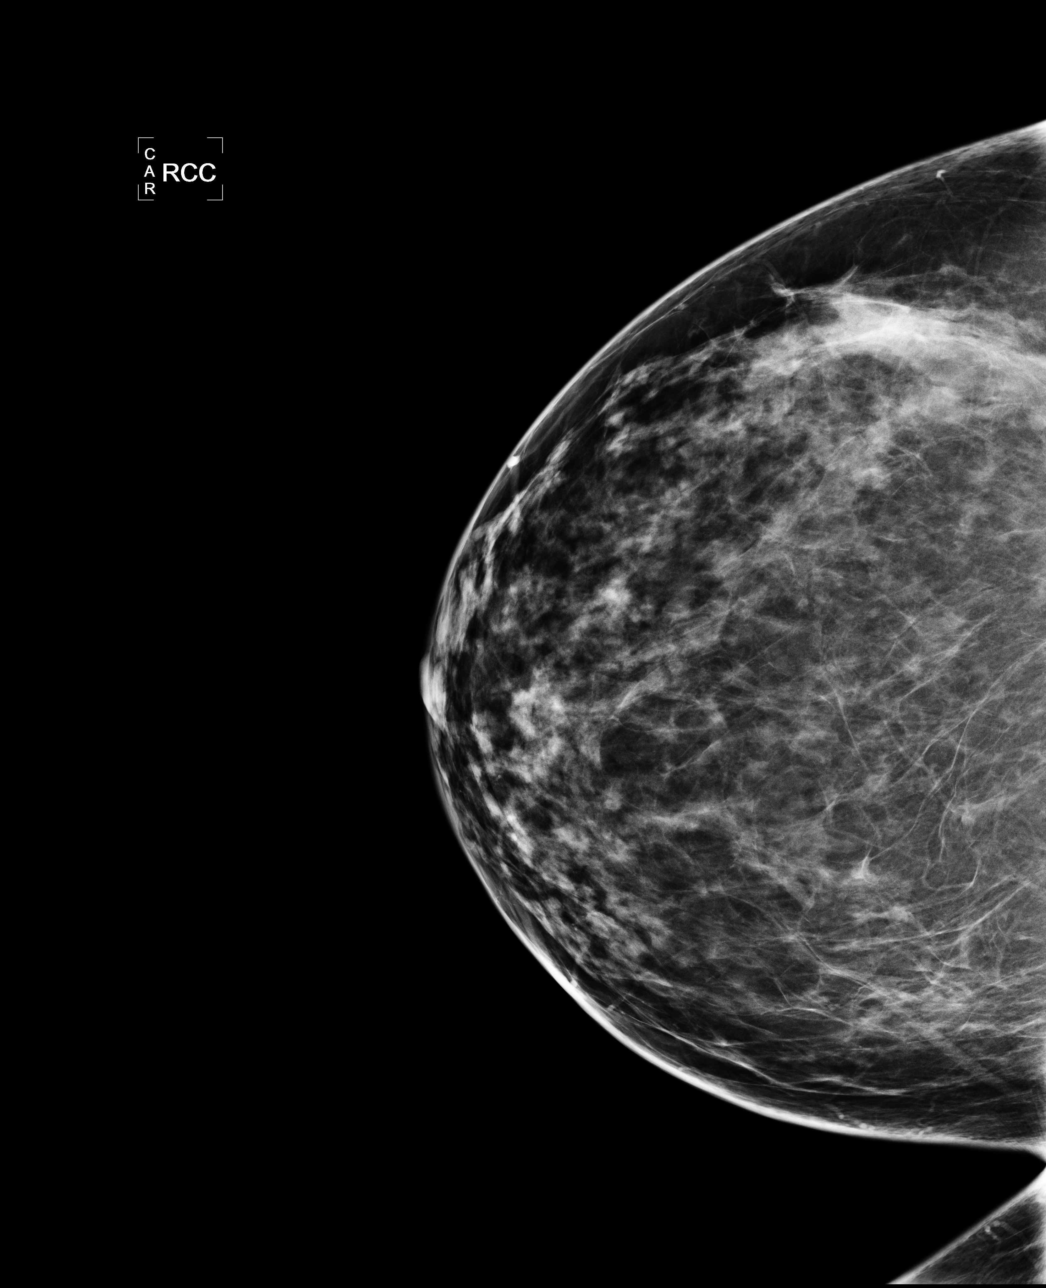

[L CC]
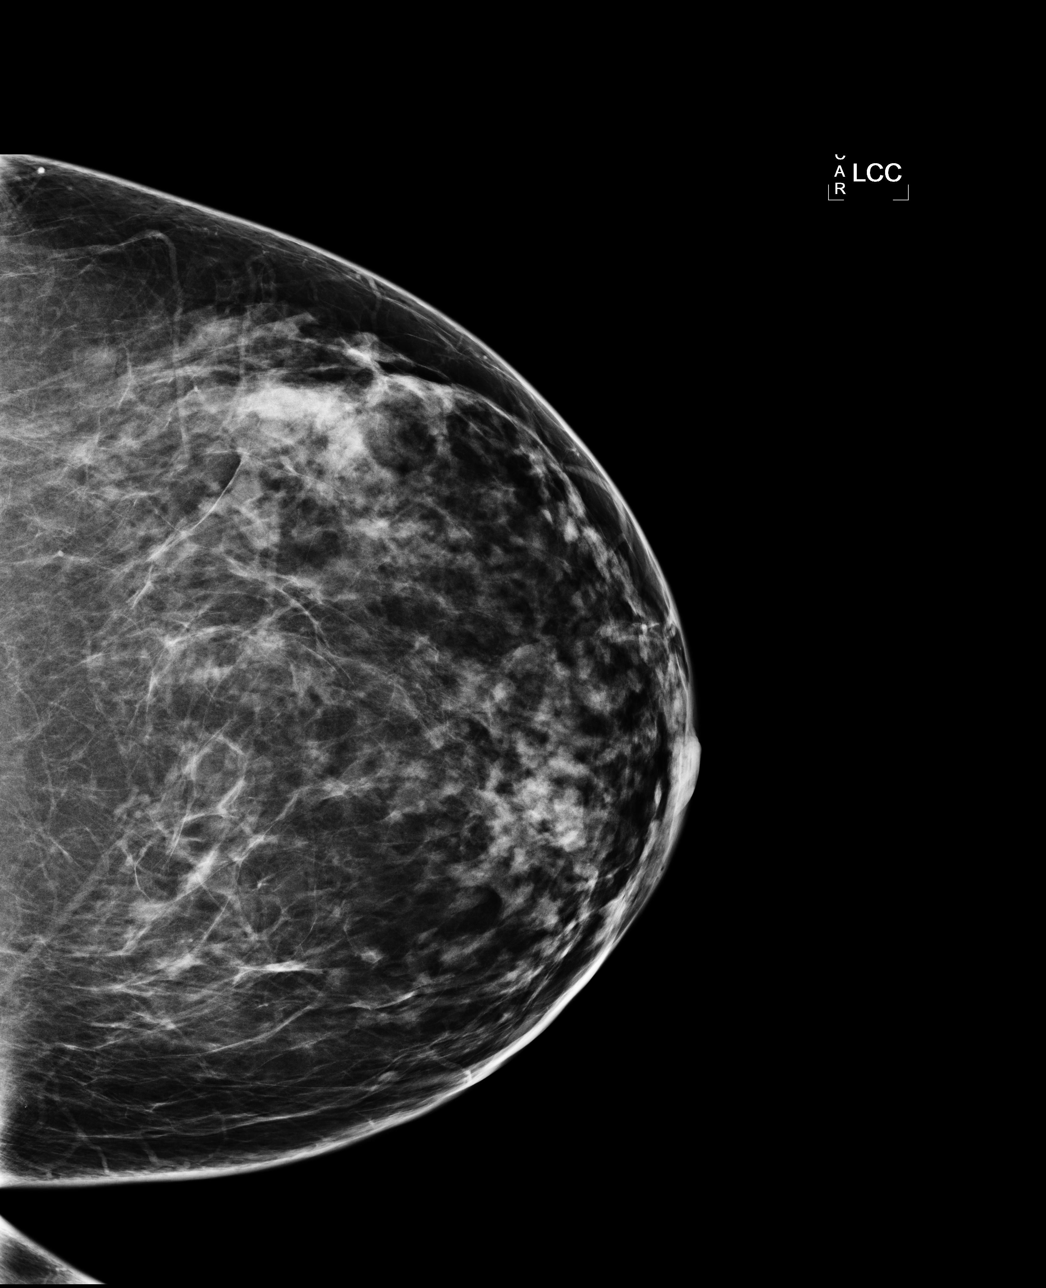

[L MLO]
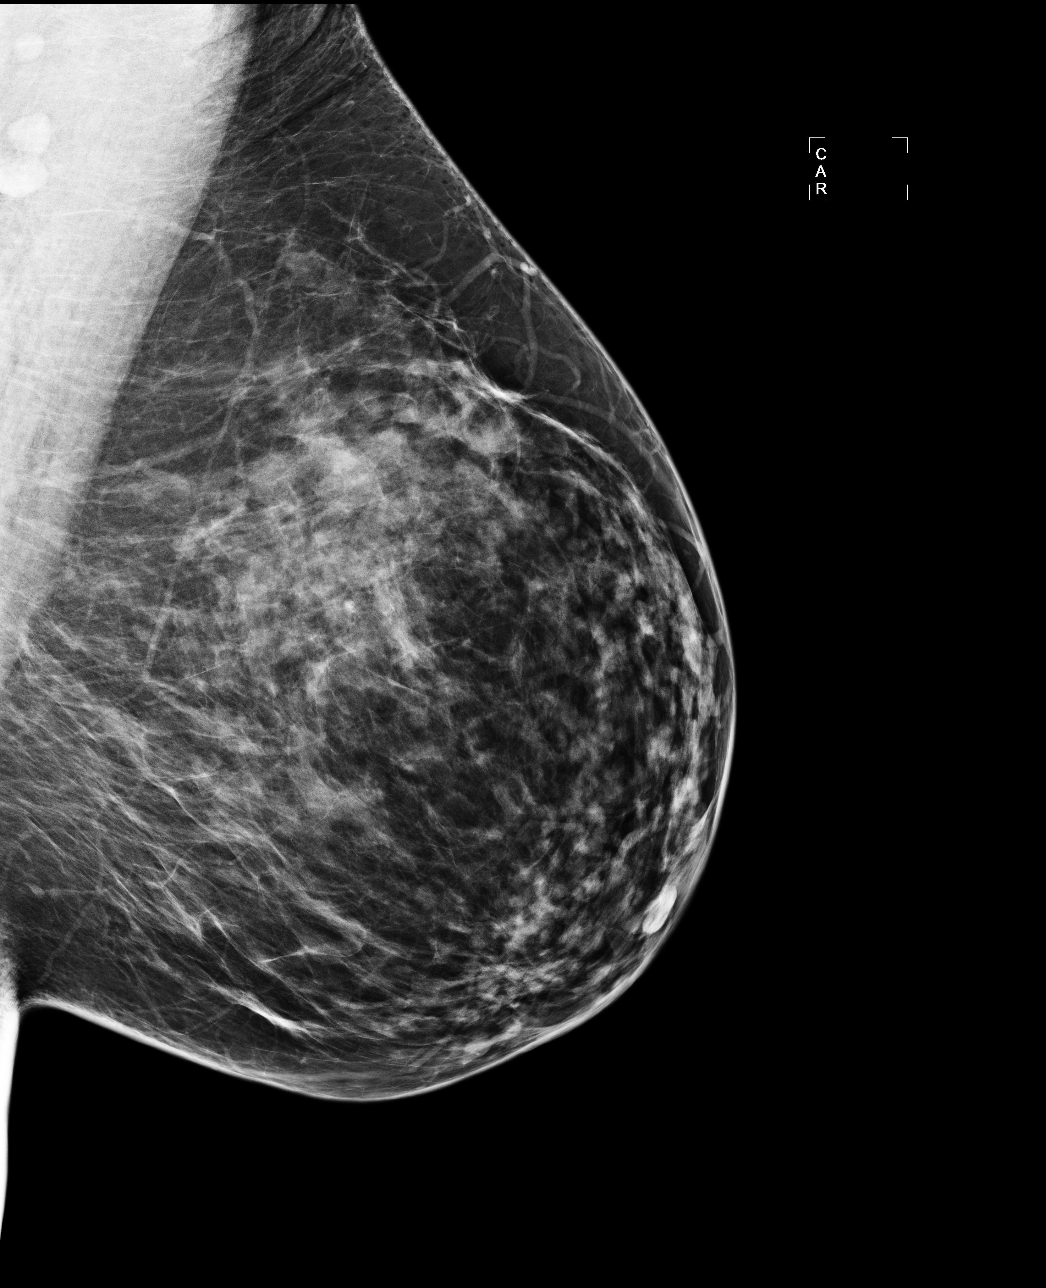

[R MLO]
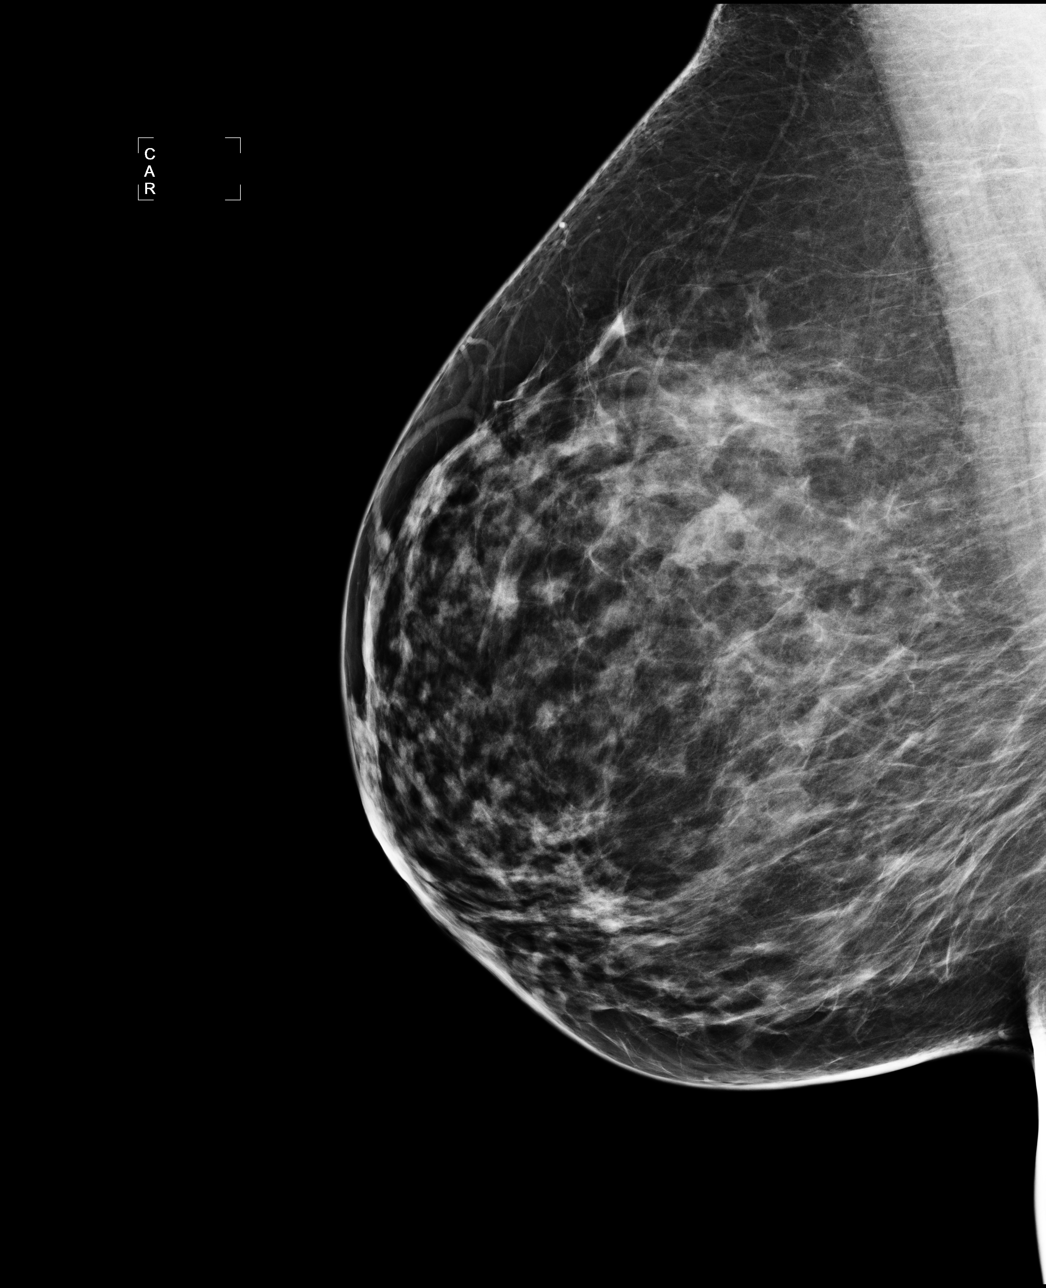

[4 of 4 positions shown; findings below may reference images not displayed]

IMPRESSION: No specific mammographic evidence of malignancy.  Next screening mammogram is recommended in one 
year.

A result letter of this screening mammogram will be mailed directly to the patient.

ASSESSMENT: Negative - BI-RADS 1

Screening mammogram in 1 year.
,

## 2012-09-20 ENCOUNTER — Telehealth: Payer: Self-pay | Admitting: *Deleted

## 2012-09-20 DIAGNOSIS — Z0000 Encounter for general adult medical examination without abnormal findings: Secondary | ICD-10-CM

## 2012-09-20 NOTE — Telephone Encounter (Signed)
Received Pa form for phentermine. Place on md for signature...Raechel Chute

## 2012-09-21 ENCOUNTER — Telehealth: Payer: Self-pay | Admitting: *Deleted

## 2012-09-21 NOTE — Telephone Encounter (Signed)
Left msg on vm checking status on the PA...Raechel Chute

## 2012-09-21 NOTE — Telephone Encounter (Signed)
Wanting to ask question concerning PA for phentermine. Needed 19-22 question answer. MD answer ? Gave over phone. Per rep once clinical has been reviewed will fax status...Raechel Chute

## 2012-09-24 ENCOUNTER — Telehealth: Payer: Self-pay | Admitting: *Deleted

## 2012-09-24 NOTE — Telephone Encounter (Signed)
Received denial letter for phentermine PA. Pt has been notified with status...[name]

## 2012-09-24 NOTE — Telephone Encounter (Signed)
Noted thanks °

## 2012-10-11 ENCOUNTER — Encounter: Payer: Self-pay | Admitting: Internal Medicine

## 2012-10-11 ENCOUNTER — Ambulatory Visit (INDEPENDENT_AMBULATORY_CARE_PROVIDER_SITE_OTHER): Payer: BC Managed Care – PPO | Admitting: Internal Medicine

## 2012-10-11 ENCOUNTER — Other Ambulatory Visit (INDEPENDENT_AMBULATORY_CARE_PROVIDER_SITE_OTHER): Payer: BC Managed Care – PPO

## 2012-10-11 ENCOUNTER — Encounter: Payer: Self-pay | Admitting: *Deleted

## 2012-10-11 VITALS — BP 130/92 | HR 90 | Temp 99.1°F | Ht 68.0 in | Wt 186.1 lb

## 2012-10-11 DIAGNOSIS — J069 Acute upper respiratory infection, unspecified: Secondary | ICD-10-CM

## 2012-10-11 DIAGNOSIS — Z0000 Encounter for general adult medical examination without abnormal findings: Secondary | ICD-10-CM

## 2012-10-11 DIAGNOSIS — Z7251 High risk heterosexual behavior: Secondary | ICD-10-CM

## 2012-10-11 DIAGNOSIS — J019 Acute sinusitis, unspecified: Secondary | ICD-10-CM

## 2012-10-11 DIAGNOSIS — K219 Gastro-esophageal reflux disease without esophagitis: Secondary | ICD-10-CM

## 2012-10-11 LAB — LIPID PANEL
Cholesterol: 202 mg/dL — ABNORMAL HIGH (ref 0–200)
HDL: 37.4 mg/dL — ABNORMAL LOW (ref >39.00)
Total CHOL/HDL Ratio: 5
VLDL: 61.2 mg/dL — ABNORMAL HIGH (ref 0.0–40.0)

## 2012-10-11 LAB — HEPATIC FUNCTION PANEL
Alkaline Phosphatase: 30 U/L — ABNORMAL LOW (ref 39–117)
Bilirubin, Direct: 0.1 mg/dL (ref 0.0–0.3)
Total Bilirubin: 0.6 mg/dL (ref 0.3–1.2)

## 2012-10-11 LAB — BASIC METABOLIC PANEL
BUN: 12 mg/dL (ref 6–23)
Calcium: 9.5 mg/dL (ref 8.4–10.5)
GFR: 75.61 mL/min (ref >60.00)
Glucose, Bld: 106 mg/dL — ABNORMAL HIGH (ref 70–99)
Potassium: 5.3 mEq/L — ABNORMAL HIGH (ref 3.5–5.1)
Sodium: 142 mEq/L (ref 135–145)

## 2012-10-11 LAB — CBC WITH DIFFERENTIAL/PLATELET
Basophils Absolute: 0 10*3/uL (ref 0.0–0.1)
Eosinophils Absolute: 0.2 10*3/uL (ref 0.0–0.7)
Hemoglobin: 12.8 g/dL (ref 12.0–15.0)
Lymphocytes Relative: 26.5 % (ref 12.0–46.0)
Monocytes Relative: 14.7 % — ABNORMAL HIGH (ref 3.0–12.0)
Neutrophils Relative %: 52.7 % (ref 43.0–77.0)
Platelets: 200 10*3/uL (ref 150.0–400.0)
RDW: 14.2 % (ref 11.5–14.6)

## 2012-10-11 LAB — HIV ANTIBODY (ROUTINE TESTING W REFLEX): HIV: NONREACTIVE

## 2012-10-11 LAB — URINALYSIS, ROUTINE W REFLEX MICROSCOPIC
Ketones, ur: NEGATIVE
Specific Gravity, Urine: >=1.03 (ref 1.000–1.030)
Urine Glucose: NEGATIVE
pH: 6 (ref 5.0–8.0)

## 2012-10-11 LAB — TSH: TSH: 2.68 u[IU]/mL (ref 0.35–5.50)

## 2012-10-11 MED ORDER — ZOLPIDEM TARTRATE 10 MG PO TABS: 10.0000 mg | ORAL_TABLET | Freq: Every evening | ORAL | Status: DC | PRN

## 2012-10-11 MED ORDER — PROMETHAZINE-CODEINE 6.25-10 MG/5ML PO SYRP: 5.0000 mL | ORAL_SOLUTION | ORAL | Status: DC | PRN

## 2012-10-11 MED ORDER — CIPROFLOXACIN HCL 500 MG PO TABS: 500.0000 mg | ORAL_TABLET | Freq: Two times a day (BID) | ORAL | Status: DC

## 2012-10-11 MED ORDER — FLUCONAZOLE 150 MG PO TABS: 150.0000 mg | ORAL_TABLET | Freq: Once | ORAL | Status: DC

## 2012-10-11 MED ORDER — OMEPRAZOLE 20 MG PO CPDR: 20.0000 mg | DELAYED_RELEASE_CAPSULE | Freq: Every day | ORAL | Status: DC

## 2012-10-11 NOTE — Progress Notes (Signed)
Subjective:    Patient ID: [name], female    DOB: [birth year], 51 y.o.   MRN: [id number]  HPI  patient is here today for annual physical and follow up.  complains of URI symptoms: cough, head congestions and yellow nasal discharge associated with low grade fever and malaise Not responding to OTC meds   Also reviewed chronic med issues: allergic rhinitis - not using claritin due to cost - allergy symptoms complicating usual headache symptoms - see next   chronic headache  - daily symptoms    no change in nature of pain or symptoms since onset not improved with OTC meds - ibuprofen no fever, no change in headache location or intensity no weakness, no vision change - no head trauma or injury, falls Denies prior neurology evaluation   hypertriglyceridemia - never on medical treatment for same - tries to follow low fat diet and exercise   history of hypertension- previously on prescribed medical therapy but none since 2010- no chest pain or edema -     GERD - prev on nexium, protonix and zantac -   no abdominal pain nausea or vomiting no history of peptic ulcer or BRBPR not using OTC meds due to ineffective relief of symptoms on same - on omeprazole starting 09/2010 - improved     Past Medical History  Diagnosis Date  . LIPOMA     R shoulder s/p excision 12/2010  . HYPERLIPIDEMIA     TGs, never on med rx  . ANEMIA-NOS   . HYPERTENSION   . ALLERGIC RHINITIS   . GERD   . Headache     chronic  . Thyroid nodule     stable [place] 09/2009 and 11/2011   Family History  Problem Relation Age of Onset  . Arthritis Other     grandparents  . Breast cancer Other     other relative  . Diabetes Other     grandparents  . Hyperlipidemia Other     grandparents  . Hypertension Other     grandparent & parents   History  Substance Use Topics  . Smoking status: Never Smoker   . Smokeless tobacco: Not on file     Comment: Divorced, lives alone -  . Alcohol Use: No    Review of  Systems  Constitutional: Negative for fever; no unexpected weight gain.  Respiratory: Negative for cough and shortness of breath.   Cardiovascular: Negative for chest pain or palpitations.  Gastrointestinal: Negative for abdominal pain, no bowel changes.  Musculoskeletal: Negative for gait problem or joint swelling.   Skin: Negative for rash or bruising.  Neurological: Negative for dizziness; positive for chronic headache No other specific complaints in a complete review of systems (except as listed in HPI above).     Objective:   Physical Exam  BP 130/92  Pulse 90  Temp 99.1 F (37.3 C) (Oral)  Ht 5\' 8"  (1.727 m)  Wt 186 lb 1.9 oz (84.423 kg)  BMI 28.30 kg/m2  SpO2 98% Wt Readings from Last 3 Encounters:  10/11/12 186 lb 1.9 oz (84.423 kg)  06/06/12 182 lb 6.4 oz (82.736 kg)  12/02/11 188 lb (85.276 kg)   Constitutional: She is hoarse, but appears well-developed and well-nourished. No distress.  HENT: Head: Normocephalic and atraumatic. Mild sinus tenderness to palpation. Ears: B TMs ok, no erythema or effusion; Nose: Nose normal. Mouth/Throat: Oropharynx is mildly red, clear and moist. No oropharyngeal exudate.  Eyes: Conjunctivae and EOM are normal. Pupils are  equal, round, and reactive to light. No scleral icterus.  Neck: Chronic nodular swelling on the right side of neck, soft, mobile and nontender. Approximately 2 cm round diameter. Normal range of motion. Neck supple. No JVD present.  Cardiovascular: Normal rate, regular rhythm and normal heart sounds.  No murmur heard. No BLE edema. Pulmonary/Chest: Effort normal and breath sounds normal. No respiratory distress. She has no wheezes.  Abdominal: Soft. Bowel sounds are normal. She exhibits no distension. There is no tenderness. no masses Musculoskeletal:  Normal range of motion, no joint effusions. No gross deformities - Neurological: She is alert and oriented to person, place, and time. No cranial nerve deficit.  Coordination normal.  Skin: Skin is warm and dry. No rash noted. No erythema.  Psychiatric: She has an anxious mood and affect. Her behavior is normal. Judgment and thought content normal.   Lab Results  Component Value Date   WBC 4.8 06/06/2012   HGB 11.6* 06/06/2012   HCT 34.0* 06/06/2012   PLT 239.0 06/06/2012   GLUCOSE 92 06/06/2012   CHOL 165 10/03/2011   TRIG 250.0* 10/03/2011   HDL 41.40 10/03/2011   LDLDIRECT 136.4 10/03/2011   LDLCALC 141* 09/11/2009   ALT 15 10/03/2011   AST 17 10/03/2011   NA 139 06/06/2012   K 3.9 06/06/2012   CL 106 06/06/2012   CREATININE 1.0 06/06/2012   BUN 14 06/06/2012   CO2 25 06/06/2012   TSH 1.61 06/06/2012   EKG - normal sinus at 90 beats per minute. No ischemic changes or arrhythmia - unchanged from 09/2011     Assessment & Plan:  CPX - v70.0 - Patient has been counseled on age-appropriate routine health concerns for screening and prevention. These are reviewed and up-to-date. Immunizations are up-to-date or declined. Labs ordered and ECG reviewed.   URI - explained lack of efficacy of antibiotics in viral disease, but empiric Cipro prescribed given duration > 2 weeks - also symptomatic care recommended: antitussive and nasal decongestants/steroids for sinusitis; hydration and rest with tylenol or ibuprofen as needed for headache and myalgia symptoms  - pt to call if symptoms worse or unimproved   Requests HIV check due to high risk sexual behavior

## 2012-10-11 NOTE — Patient Instructions (Signed)
It was good to see you today. Health Maintenance reviewed - all recommended immunizations and age-appropriate screenings are up-to-date. Test(s) ordered today. Your results will be released to MyChart (or called to you) after review, usually within 72hours after test completion. If any changes need to be made, you will be notified at that same time. We have reviewed your prior records including labs, medications and tests today cipro antibiotics and prescription cough syrup for your infection and cough symptoms -  Your prescription(s)/refills have been submitted to your pharmacy. Please take as directed and contact our office if you believe you are having problem(s) with the medication(s). Alternate between ibuprofen and tylenol for aches, pain and fever symptoms as discussed Hydrate, rest and call if worse or unimproved Work excuse note for today as discussed Please schedule followup in 6 months for blood pressure and weight check, call sooner if problems.  Health Maintenance, Females A healthy lifestyle and preventative care can promote health and wellness.  Maintain regular health, dental, and eye exams.   Eat a healthy diet. Foods like vegetables, fruits, whole grains, low-fat dairy products, and lean protein foods contain the nutrients you need without too many calories. Decrease your intake of foods high in solid fats, added sugars, and salt. Get information about a proper diet from your caregiver, if necessary.   Regular physical exercise is one of the most important things you can do for your health. Most adults should get at least 150 minutes of moderate-intensity exercise (any activity that increases your heart rate and causes you to sweat) each week. In addition, most adults need muscle-strengthening exercises on 2 or more days a week.     Maintain a healthy weight. The body mass index (BMI) is a screening tool to identify possible weight problems. It provides an estimate of body fat  based on height and weight. Your caregiver can help determine your BMI, and can help you achieve or maintain a healthy weight. For adults 20 years and older:   A BMI below 18.5 is considered underweight.   A BMI of 18.5 to 24.9 is normal.   A BMI of 25 to 29.9 is considered overweight.   A BMI of 30 and above is considered obese.   Maintain normal blood lipids and cholesterol by exercising and minimizing your intake of saturated fat. Eat a balanced diet with plenty of fruits and vegetables. Blood tests for lipids and cholesterol should begin at age 34 and be repeated every 5 years. If your lipid or cholesterol levels are high, you are over 50, or you are a high risk for heart disease, you may need your cholesterol levels checked more frequently. Ongoing high lipid and cholesterol levels should be treated with medicines if diet and exercise are not effective.   If you smoke, find out from your caregiver how to quit. If you do not use tobacco, do not start.   If you are pregnant, do not drink alcohol. If you are breastfeeding, be very cautious about drinking alcohol. If you are not pregnant and choose to drink alcohol, do not exceed 1 drink per day. One drink is considered to be 12 ounces (355 mL) of beer, 5 ounces (148 mL) of wine, or 1.5 ounces (44 mL) of liquor.   Avoid use of street drugs. Do not share needles with anyone. Ask for help if you need support or instructions about stopping the use of drugs.   High blood pressure causes heart disease and increases the risk of  stroke. Blood pressure should be checked at least every 1 to 2 years. Ongoing high blood pressure should be treated with medicines, if weight loss and exercise are not effective.   If you are 51 to 49 years old, ask your caregiver if you should take aspirin to prevent strokes.   Diabetes screening involves taking a blood sample to check your fasting blood sugar level. This should be done once every 3 years, after age 69, if  you are within normal weight and without risk factors for diabetes. Testing should be considered at a younger age or be carried out more frequently if you are overweight and have at least 1 risk factor for diabetes.   Breast cancer screening is essential preventative care for women. You should practice "breast self-awareness." This means understanding the normal appearance and feel of your breasts and may include breast self-examination. Any changes detected, no matter how small, should be reported to a caregiver. Women in their 4s and 30s should have a clinical breast exam (CBE) by a caregiver as part of a regular health exam every 1 to 3 years. After age 66, women should have a CBE every year. Starting at age 58, women should consider having a mammogram (breast X-ray) every year. Women who have a family history of breast cancer should talk to their caregiver about genetic screening. Women at a high risk of breast cancer should talk to their caregiver about having an MRI and a mammogram every year.   The Pap test is a screening test for cervical cancer. Women should have a Pap test starting at age 49. Between ages 21 and 61, Pap tests should be repeated every 2 years. Beginning at age 26, you should have a Pap test every 3 years as long as the past 3 Pap tests have been normal. If you had a hysterectomy for a problem that was not cancer or a condition that could lead to cancer, then you no longer need Pap tests. If you are between ages 100 and 21, and you have had normal Pap tests going back 10 years, you no longer need Pap tests. If you have had past treatment for cervical cancer or a condition that could lead to cancer, you need Pap tests and screening for cancer for at least 20 years after your treatment. If Pap tests have been discontinued, risk factors (such as a new sexual partner) need to be reassessed to determine if screening should be resumed. Some women have medical problems that increase the chance  of getting cervical cancer. In these cases, your caregiver may recommend more frequent screening and Pap tests.   The human papillomavirus (HPV) test is an additional test that may be used for cervical cancer screening. The HPV test looks for the virus that can cause the cell changes on the cervix. The cells collected during the Pap test can be tested for HPV. The HPV test could be used to screen women aged 66 years and older, and should be used in women of any age who have unclear Pap test results. After the age of 70, women should have HPV testing at the same frequency as a Pap test.   Colorectal cancer can be detected and often prevented. Most routine colorectal cancer screening begins at the age of 70 and continues through age 39. However, your caregiver may recommend screening at an earlier age if you have risk factors for colon cancer. On a yearly basis, your caregiver may provide home test kits to  check for hidden blood in the stool. Use of a small camera at the end of a tube, to directly examine the colon (sigmoidoscopy or colonoscopy), can detect the earliest forms of colorectal cancer. Talk to your caregiver about this at age 9, when routine screening begins. Direct examination of the colon should be repeated every 5 to 10 years through age 19, unless early forms of pre-cancerous polyps or small growths are found.   Hepatitis C blood testing is recommended for all people born from [birth year] through 1963 and any individual with known risks for hepatitis C.   Practice safe sex. Use condoms and avoid high-risk sexual practices to reduce the spread of sexually transmitted infections (STIs). Sexually active women aged 44 and younger should be checked for Chlamydia, which is a common sexually transmitted infection. Older women with new or multiple partners should also be tested for Chlamydia. Testing for other STIs is recommended if you are sexually active and at increased risk.   Osteoporosis is a disease  in which the bones lose minerals and strength with aging. This can result in serious bone fractures. The risk of osteoporosis can be identified using a bone density scan. Women ages 36 and over and women at risk for fractures or osteoporosis should discuss screening with their caregivers. Ask your caregiver whether you should be taking a calcium supplement or vitamin D to reduce the rate of osteoporosis.   Menopause can be associated with physical symptoms and risks. Hormone replacement therapy is available to decrease symptoms and risks. You should talk to your caregiver about whether hormone replacement therapy is right for you.   Use sunscreen with a sun protection factor (SPF) of 30 or greater. Apply sunscreen liberally and repeatedly throughout the day. You should seek shade when your shadow is shorter than you. Protect yourself by wearing long sleeves, pants, a wide-brimmed hat, and sunglasses year round, whenever you are outdoors.   Notify your caregiver of new moles or changes in moles, especially if there is a change in shape or color. Also notify your caregiver if a mole is larger than the size of a pencil eraser.   Stay current with your immunizations.  Document Released: 05/09/2011 Document Revised: 01/16/2012 Document Reviewed: 05/09/2011 [facility] Patient Information 2013 [place], [place].   Upper Respiratory Infection, Adult An upper respiratory infection (URI) is also sometimes known as the common cold. The upper respiratory tract includes the nose, sinuses, throat, trachea, and bronchi. Bronchi are the airways leading to the lungs. Most people improve within 1 week, but symptoms can last up to 2 weeks. A residual cough may last even longer.   CAUSES Many different viruses can infect the tissues lining the upper respiratory tract. The tissues become irritated and inflamed and often become very moist. Mucus production is also common. A cold is contagious. You can easily spread the virus to  others by oral contact. This includes kissing, sharing a glass, coughing, or sneezing. Touching your mouth or nose and then touching a surface, which is then touched by another person, can also spread the virus. SYMPTOMS   Symptoms typically develop 1 to 3 days after you come in contact with a cold virus. Symptoms vary from person to person. They may include:  Runny nose.   Sneezing.   Nasal congestion.   Sinus irritation.   Sore throat.   Loss of voice (laryngitis).   Cough.   Fatigue.   Muscle aches.   Loss of appetite.   Headache.  Low-grade fever.  DIAGNOSIS   You might diagnose your own cold based on familiar symptoms, since most people get a cold 2 to 3 times a year. Your caregiver can confirm this based on your exam. Most importantly, your caregiver can check that your symptoms are not due to another disease such as strep throat, sinusitis, pneumonia, asthma, or epiglottitis. Blood tests, throat tests, and X-rays are not necessary to diagnose a common cold, but they may sometimes be helpful in excluding other more serious diseases. Your caregiver will decide if any further tests are required. RISKS AND COMPLICATIONS   You may be at risk for a more severe case of the common cold if you smoke cigarettes, have chronic heart disease (such as heart failure) or lung disease (such as asthma), or if you have a weakened immune system. The very young and very old are also at risk for more serious infections. Bacterial sinusitis, middle ear infections, and bacterial pneumonia can complicate the common cold. The common cold can worsen asthma and chronic obstructive pulmonary disease (COPD). Sometimes, these complications can require emergency medical care and may be life-threatening. PREVENTION   The best way to protect against getting a cold is to practice good hygiene. Avoid oral or hand contact with people with cold symptoms. Wash your hands often if contact occurs. There is no clear  evidence that vitamin C, vitamin E, echinacea, or exercise reduces the chance of developing a cold. However, it is always recommended to get plenty of rest and practice good nutrition. TREATMENT   Treatment is directed at relieving symptoms. There is no cure. Antibiotics are not effective, because the infection is caused by a virus, not by bacteria. Treatment may include:  Increased fluid intake. Sports drinks offer valuable electrolytes, sugars, and fluids.   Breathing heated mist or steam (vaporizer or shower).   Eating chicken soup or other clear broths, and maintaining good nutrition.   Getting plenty of rest.   Using gargles or lozenges for comfort.   Controlling fevers with ibuprofen or acetaminophen as directed by your caregiver.   Increasing usage of your inhaler if you have asthma.  Zinc gel and zinc lozenges, taken in the first 24 hours of the common cold, can shorten the duration and lessen the severity of symptoms. Pain medicines may help with fever, muscle aches, and throat pain. A variety of non-prescription medicines are available to treat congestion and runny nose. Your caregiver can make recommendations and may suggest nasal or lung inhalers for other symptoms.   HOME CARE INSTRUCTIONS    Only take over-the-counter or prescription medicines for pain, discomfort, or fever as directed by your caregiver.   Use a warm mist humidifier or inhale steam from a shower to increase air moisture. This may keep secretions moist and make it easier to breathe.   Drink enough water and fluids to keep your urine clear or pale yellow.   Rest as needed.   Return to work when your temperature has returned to normal or as your caregiver advises. You may need to stay home longer to avoid infecting others. You can also use a face mask and careful hand washing to prevent spread of the virus.  SEEK MEDICAL CARE IF:    After the first few days, you feel you are getting worse rather than better.    You need your caregiver's advice about medicines to control symptoms.   You develop chills, worsening shortness of breath, or brown or red sputum. These may be  signs of pneumonia.   You develop yellow or brown nasal discharge or pain in the face, especially when you bend forward. These may be signs of sinusitis.   You develop a fever, swollen neck glands, pain with swallowing, or white areas in the back of your throat. These may be signs of strep throat.  SEEK IMMEDIATE MEDICAL CARE IF:    You have a fever.   You develop severe or persistent headache, ear pain, sinus pain, or chest pain.   You develop wheezing, a prolonged cough, cough up blood, or have a change in your usual mucus (if you have chronic lung disease).   You develop sore muscles or a stiff neck.  Document Released: 04/19/2001 Document Revised: 01/16/2012 Document Reviewed: 02/25/2011 [facility] Patient Information 2013 [place], [place].

## 2012-10-11 NOTE — Assessment & Plan Note (Signed)
Prior treatment with Nexium, AcipHex, Protonix and Zantac ineffective Continue omeprazole as ongoing

## 2012-10-11 NOTE — Addendum Note (Signed)
Addended by: Deatra James on: 10/11/2012 09:40 AM   Modules accepted: Orders

## 2012-10-12 ENCOUNTER — Encounter: Payer: Self-pay | Admitting: Internal Medicine

## 2012-10-12 NOTE — Telephone Encounter (Signed)
Again, if you need further discussion or explaination, this is best done in person at office visit. thanks

## 2012-10-16 ENCOUNTER — Telehealth: Payer: Self-pay | Admitting: *Deleted

## 2012-10-16 NOTE — Telephone Encounter (Signed)
Generated dismissal form & letter. Place on md desk for signature...lmb

## 2012-10-18 NOTE — Telephone Encounter (Signed)
MD sign dismissal letter. Gave to office manager for process...Raechel Chute

## 2012-10-19 ENCOUNTER — Telehealth: Payer: Self-pay | Admitting: Internal Medicine

## 2012-10-19 NOTE — Telephone Encounter (Signed)
Dismissal Letter sent by Certified Mail 10/19/2012  Dismissal Letter returned Unclaimed 12/19/2012  Dismissal Letter sent by 1st Class Mail to [address] Saintclair Halsted, [facility] 12/25/2012

## 2012-10-22 ENCOUNTER — Ambulatory Visit
Admission: RE | Admit: 2012-10-22 | Discharge: 2012-10-22 | Disposition: A | Discharge status: Home / Self Care | Payer: BC Managed Care – PPO | Source: Ambulatory Visit | Attending: Internal Medicine | Admitting: Internal Medicine

## 2012-10-22 DIAGNOSIS — N6452 Nipple discharge: Secondary | ICD-10-CM

## 2013-04-26 ENCOUNTER — Other Ambulatory Visit: Payer: Self-pay | Admitting: Internal Medicine

## 2013-05-22 ENCOUNTER — Other Ambulatory Visit: Payer: Self-pay | Admitting: Internal Medicine

## 2013-09-12 ENCOUNTER — Other Ambulatory Visit: Payer: Self-pay

## 2013-09-26 ENCOUNTER — Other Ambulatory Visit: Payer: Self-pay

## 2013-09-26 DIAGNOSIS — Z1231 Encounter for screening mammogram for malignant neoplasm of breast: Secondary | ICD-10-CM

## 2013-09-27 ENCOUNTER — Other Ambulatory Visit: Payer: Self-pay | Admitting: Internal Medicine

## 2013-09-27 DIAGNOSIS — R209 Unspecified disturbances of skin sensation: Secondary | ICD-10-CM

## 2013-10-05 ENCOUNTER — Other Ambulatory Visit: Payer: BC Managed Care – PPO

## 2013-10-12 ENCOUNTER — Other Ambulatory Visit: Payer: BC Managed Care – PPO

## 2013-10-22 ENCOUNTER — Other Ambulatory Visit (HOSPITAL_COMMUNITY): Payer: BC Managed Care – PPO

## 2013-10-23 ENCOUNTER — Other Ambulatory Visit (HOSPITAL_COMMUNITY): Payer: Self-pay | Admitting: Internal Medicine

## 2013-10-23 ENCOUNTER — Ambulatory Visit: Payer: BC Managed Care – PPO

## 2013-10-23 DIAGNOSIS — E041 Nontoxic single thyroid nodule: Secondary | ICD-10-CM

## 2013-11-11 ENCOUNTER — Other Ambulatory Visit: Payer: Self-pay | Admitting: Gastroenterology

## 2013-11-11 ENCOUNTER — Ambulatory Visit
Admission: RE | Admit: 2013-11-11 | Discharge: 2013-11-11 | Disposition: A | Discharge status: Home / Self Care | Payer: Commercial Managed Care - PPO | Source: Ambulatory Visit

## 2013-11-11 ENCOUNTER — Ambulatory Visit (HOSPITAL_COMMUNITY): Payer: Commercial Managed Care - PPO

## 2013-11-11 ENCOUNTER — Ambulatory Visit (HOSPITAL_COMMUNITY)
Admission: RE | Admit: 2013-11-11 | Discharge: 2013-11-11 | Disposition: A | Discharge status: Home / Self Care | Payer: Commercial Managed Care - PPO | Source: Ambulatory Visit | Attending: Internal Medicine | Admitting: Internal Medicine

## 2013-11-11 DIAGNOSIS — E041 Nontoxic single thyroid nodule: Secondary | ICD-10-CM

## 2013-11-11 DIAGNOSIS — E042 Nontoxic multinodular goiter: Secondary | ICD-10-CM | POA: Insufficient documentation

## 2013-11-11 DIAGNOSIS — Z1231 Encounter for screening mammogram for malignant neoplasm of breast: Secondary | ICD-10-CM

## 2013-11-25 ENCOUNTER — Ambulatory Visit (HOSPITAL_COMMUNITY)
Admission: RE | Admit: 2013-11-25 | Discharge: 2013-11-25 | Disposition: A | Discharge status: Home / Self Care | Payer: Commercial Managed Care - PPO | Source: Ambulatory Visit | Attending: Internal Medicine | Admitting: Internal Medicine

## 2013-11-25 DIAGNOSIS — G43909 Migraine, unspecified, not intractable, without status migrainosus: Secondary | ICD-10-CM | POA: Insufficient documentation

## 2013-11-25 DIAGNOSIS — M5126 Other intervertebral disc displacement, lumbar region: Secondary | ICD-10-CM | POA: Insufficient documentation

## 2013-11-25 DIAGNOSIS — R209 Unspecified disturbances of skin sensation: Secondary | ICD-10-CM | POA: Insufficient documentation

## 2013-11-25 MED ORDER — GADOBENATE DIMEGLUMINE 529 MG/ML IV SOLN
20.0000 mL | Freq: Once | INTRAVENOUS | Status: AC
  Administered 2013-11-25: 18 mL via INTRAVENOUS

## 2013-12-13 ENCOUNTER — Ambulatory Visit: Payer: 59 | Admitting: Neurology

## 2013-12-18 ENCOUNTER — Encounter (HOSPITAL_COMMUNITY): Payer: Self-pay | Admitting: Emergency Medicine

## 2013-12-18 ENCOUNTER — Emergency Department (HOSPITAL_COMMUNITY): Payer: Commercial Managed Care - PPO

## 2013-12-18 ENCOUNTER — Emergency Department (HOSPITAL_COMMUNITY)
Admission: EM | Admit: 2013-12-18 | Discharge: 2013-12-18 | Disposition: A | Discharge status: Home / Self Care | Payer: Commercial Managed Care - PPO | Attending: Emergency Medicine | Admitting: Emergency Medicine

## 2013-12-18 DIAGNOSIS — Z862 Personal history of diseases of the blood and blood-forming organs and certain disorders involving the immune mechanism: Secondary | ICD-10-CM | POA: Insufficient documentation

## 2013-12-18 DIAGNOSIS — Z792 Long term (current) use of antibiotics: Secondary | ICD-10-CM | POA: Insufficient documentation

## 2013-12-18 DIAGNOSIS — Z880 Allergy status to penicillin: Secondary | ICD-10-CM | POA: Insufficient documentation

## 2013-12-18 DIAGNOSIS — R0781 Pleurodynia: Secondary | ICD-10-CM

## 2013-12-18 DIAGNOSIS — I10 Essential (primary) hypertension: Secondary | ICD-10-CM | POA: Insufficient documentation

## 2013-12-18 DIAGNOSIS — Z791 Long term (current) use of non-steroidal anti-inflammatories (NSAID): Secondary | ICD-10-CM | POA: Insufficient documentation

## 2013-12-18 DIAGNOSIS — Z8639 Personal history of other endocrine, nutritional and metabolic disease: Secondary | ICD-10-CM | POA: Insufficient documentation

## 2013-12-18 DIAGNOSIS — R0602 Shortness of breath: Secondary | ICD-10-CM | POA: Insufficient documentation

## 2013-12-18 DIAGNOSIS — K219 Gastro-esophageal reflux disease without esophagitis: Secondary | ICD-10-CM | POA: Insufficient documentation

## 2013-12-18 DIAGNOSIS — R05 Cough: Secondary | ICD-10-CM | POA: Insufficient documentation

## 2013-12-18 DIAGNOSIS — R059 Cough, unspecified: Secondary | ICD-10-CM | POA: Insufficient documentation

## 2013-12-18 DIAGNOSIS — Z8709 Personal history of other diseases of the respiratory system: Secondary | ICD-10-CM | POA: Insufficient documentation

## 2013-12-18 DIAGNOSIS — R071 Chest pain on breathing: Secondary | ICD-10-CM | POA: Insufficient documentation

## 2013-12-18 DIAGNOSIS — Z79899 Other long term (current) drug therapy: Secondary | ICD-10-CM | POA: Insufficient documentation

## 2013-12-18 LAB — BASIC METABOLIC PANEL
BUN: 14 mg/dL (ref 6–23)
CHLORIDE: 100 meq/L (ref 96–112)
CO2: 23 mEq/L (ref 19–32)
Calcium: 9.9 mg/dL (ref 8.4–10.5)
Creatinine, Ser: 0.89 mg/dL (ref 0.50–1.10)
GFR calc non Af Amer: 74 mL/min — ABNORMAL LOW (ref >90)
GFR, EST AFRICAN AMERICAN: 86 mL/min — AB (ref >90)
Glucose, Bld: 96 mg/dL (ref 70–99)
POTASSIUM: 4.5 meq/L (ref 3.7–5.3)
Sodium: 137 mEq/L (ref 137–147)

## 2013-12-18 LAB — CBC
HCT: 37.1 % (ref 36.0–46.0)
HEMOGLOBIN: 12.8 g/dL (ref 12.0–15.0)
MCH: 27.6 pg (ref 26.0–34.0)
MCHC: 34.5 g/dL (ref 30.0–36.0)
MCV: 80 fL (ref 78.0–100.0)
Platelets: 260 10*3/uL (ref 150–400)
RBC: 4.64 MIL/uL (ref 3.87–5.11)
RDW: 14.5 % (ref 11.5–15.5)
WBC: 4.8 10*3/uL (ref 4.0–10.5)

## 2013-12-18 LAB — POCT I-STAT TROPONIN I: TROPONIN I, POC: 0 ng/mL (ref 0.00–0.08)

## 2013-12-18 LAB — D-DIMER, QUANTITATIVE (NOT AT ARMC): D-Dimer, Quant: 0.29 ug/mL-FEU (ref 0.00–0.48)

## 2013-12-18 LAB — PRO B NATRIURETIC PEPTIDE: Pro B Natriuretic peptide (BNP): 9.4 pg/mL (ref 0–125)

## 2013-12-18 MED ORDER — SODIUM CHLORIDE 0.9 % IV SOLN: 1500.0000 mg | Freq: Once | INTRAVENOUS | Status: DC

## 2013-12-18 MED ORDER — ALBUTEROL SULFATE HFA 108 (90 BASE) MCG/ACT IN AERS
2.0000 | INHALATION_SPRAY | RESPIRATORY_TRACT | Status: DC | PRN
  Administered 2013-12-18: 2 via RESPIRATORY_TRACT
  Filled 2013-12-18: qty 6.7

## 2013-12-18 NOTE — ED Provider Notes (Signed)
Medical screening examination/treatment/procedure(s) were performed by non-physician practitioner and as supervising physician I was immediately available for consultation/collaboration.  EKG Interpretation    Date/Time:  Wednesday December 18 2013 12:16:09 EST Ventricular Rate:  89 PR Interval:  138 QRS Duration: 77 QT Interval:  379 QTC Calculation: 461 R Axis:   66 Text Interpretation:  Sinus rhythm Low voltage, precordial leads No significant change since last tracing Confirmed by Wilson Singer  MD, STEPHEN ([id number]) on 12/18/2013 12:18:41 PM              Tanna Furry, MD 12/18/13 [id number]

## 2013-12-18 NOTE — Discharge Instructions (Signed)
Use albuterol, 2 puffs every 4 hrs as needed for shortness of breath.  Follow up with your doctor for further evaluation of your symptoms.  Return if your symptoms worsen or if you have other concerns.   Shortness of Breath Shortness of breath means you have trouble breathing. Shortness of breath may indicate that you have a medical problem. You should seek immediate medical care for shortness of breath. CAUSES   Not enough oxygen in the air (as with high altitudes or a smoke-filled room).  Short-term (acute) lung disease, including:  Infections, such as pneumonia.  Fluid in the lungs, such as heart failure.  A blood clot in the lungs (pulmonary embolism).  Long-term (chronic) lung diseases.  Heart disease (heart attack, angina, heart failure, and others).  Low red blood cells (anemia).  Poor physical fitness. This can cause shortness of breath when you exercise.  Chest or back injuries or stiffness.  Being overweight.  Smoking.  Anxiety. This can make you feel like you are not getting enough air. DIAGNOSIS  Serious medical problems can usually be found during your physical exam. Tests may also be done to determine why you are having shortness of breath. Tests may include:  Chest X-rays.  Lung function tests.  Blood tests.  Electrocardiography.  Exercise testing.  Echocardiography.  Imaging scans. Your caregiver may not be able to find a cause for your shortness of breath after your exam. In this case, it is important to have a follow-up exam with your caregiver as directed.  TREATMENT  Treatment for shortness of breath depends on the cause of your symptoms and can vary greatly. HOME CARE INSTRUCTIONS   Do not smoke. Smoking is a common cause of shortness of breath. If you smoke, ask for help to quit.  Avoid being around chemicals or things that may bother your breathing, such as paint fumes and dust.  Rest as needed. Slowly resume your usual activities.  If  medicines were prescribed, take them as directed for the full length of time directed. This includes oxygen and any inhaled medicines.  Keep all follow-up appointments as directed by your caregiver. SEEK MEDICAL CARE IF:   Your condition does not improve in the time expected.  You have a hard time doing your normal activities even with rest.  You have any side effects or problems with the medicines prescribed.  You develop any new symptoms. SEEK IMMEDIATE MEDICAL CARE IF:   Your shortness of breath gets worse.  You feel lightheaded, faint, or develop a cough not controlled with medicines.  You start coughing up blood.  You have pain with breathing.  You have chest pain or pain in your arms, shoulders, or abdomen.  You have a fever.  You are unable to walk up stairs or exercise the way you normally do. MAKE SURE YOU:  Understand these instructions.  Will watch your condition.  Will get help right away if you are not doing well or get worse. Document Released: 07/19/2001 Document Revised: 04/24/2012 Document Reviewed: 01/09/2012 [facility] Patient Information 2014 [place].

## 2013-12-18 NOTE — ED Provider Notes (Signed)
CSN: [id number]     Arrival date & time 12/18/13  1209 History   First MD Initiated Contact with Patient 12/18/13 1459     Chief Complaint  Patient presents with  . Chest Pain  . Shortness of Breath     (Consider location/radiation/quality/duration/timing/severity/associated sxs/prior Treatment) HPI  53 year old female with history of hypertension, HLD, GERD presents c/o CP and SOB.  Patient reports gradual onset of pleuritic chest pain with associate shortness of breath ongoing for the past 4 days. States pain is to her left chest, occasionally radiates to her right arm, worsened when she takes a deep breath. States she gets out of breath with ambulation and with deep breathing. Nothing seemed to make symptoms better or worse. Patient reports occasional cough. Denies fever, chills, lightheadedness, dizziness, nausea, diaphoresis, abdominal pain, vomiting, diarrhea, numbness or weakness. She denies any rash. No significant history of heart disease but does have family history of heart disease. She is a nonsmoker. To start taking birth control pill, almost, no recent surgery, no prolonged bed rest, no prior history of PE DVT, no history of cancer. Aside from taking her usual allergy medications she hasn't tried any other specific treatment. No prior provocative cardiac test.  Past Medical History  Diagnosis Date  . LIPOMA     R shoulder s/p excision 12/2010  . HYPERLIPIDEMIA     TGs, never on med rx  . ANEMIA-NOS   . HYPERTENSION   . ALLERGIC RHINITIS   . GERD   . Headache(784.0)     chronic  . Thyroid nodule     stable [place] 09/2009 and 11/2011   Past Surgical History  Procedure Laterality Date  . Abdominal hysterectomy  2007  . (r) shoulder lipoma excision  12/2010    [facility]   Family History  Problem Relation Age of Onset  . Arthritis Other     grandparents  . Breast cancer Other     other relative  . Diabetes Other     grandparents  . Hyperlipidemia Other    grandparents  . Hypertension Other     grandparent & parents   History  Substance Use Topics  . Smoking status: Never Smoker   . Smokeless tobacco: Not on file     Comment: Divorced, lives alone -  . Alcohol Use: Yes     Comment: occ   OB History   Grav Para Term Preterm Abortions TAB SAB Ect Mult Living   2 2 2       2      Review of Systems  All other systems reviewed and are negative.      Allergies  Imodium; Penicillins; Doxycycline; and Percocet  Home Medications   Current Outpatient Rx  Name  Route  Sig  Dispense  Refill  . ciprofloxacin (CIPRO) 500 MG tablet   Oral   Take 1 tablet (500 mg total) by mouth 2 (two) times daily.   20 tablet   0   . diphenhydrAMINE (BENADRYL) 25 mg capsule   Oral   Take 25 mg by mouth every 6 (six) hours as needed. Patient takes for allergies         . fluconazole (DIFLUCAN) 150 MG tablet   Oral   Take 1 tablet (150 mg total) by mouth once.   1 tablet   0   . meloxicam (MOBIC) 15 MG tablet   Oral   Take 15 mg by mouth daily.         Marland Kitchen omeprazole (  PRILOSEC) 20 MG capsule   Oral   Take 1 capsule (20 mg total) by mouth daily.   30 capsule   11   . promethazine-codeine (PHENERGAN WITH CODEINE) 6.25-10 MG/5ML syrup   Oral   Take 5 mLs by mouth every 4 (four) hours as needed for cough.   180 mL   0   . zolpidem (AMBIEN) 10 MG tablet   Oral   Take 1 tablet (10 mg total) by mouth at bedtime as needed for sleep.   30 tablet   5    BP 147/101  Pulse 88  Temp(Src) 97.7 F (36.5 C) (Oral)  Resp 22  SpO2 100% Physical Exam  Nursing note and vitals reviewed. Constitutional: She appears well-developed and well-nourished. No distress.  Awake, alert, nontoxic appearance  HENT:  Head: Atraumatic.  Right Ear: External ear normal.  Left Ear: External ear normal.  Mouth/Throat: Oropharynx is clear and moist.  Eyes: Conjunctivae are normal. Right eye exhibits no discharge. Left eye exhibits no discharge.  Neck:  Neck supple. No JVD present.  Cardiovascular: Normal rate and regular rhythm.  Exam reveals no gallop and no friction rub.   No murmur heard. Pulmonary/Chest: Effort normal and breath sounds normal. No respiratory distress. She has no wheezes. She has no rales. She exhibits no tenderness.  Abdominal: Soft. There is no tenderness. There is no rebound.  Musculoskeletal: She exhibits no tenderness.  ROM appears intact, no obvious focal weakness  BLE without palpable cords, erythema, edema, negative Homan sign.  Neurological: She is alert.  Mental status and motor strength appears intact  Skin: No rash noted.  Psychiatric: She has a normal mood and affect.    ED Course  Procedures (including critical care time)  3:11 PM Pt here with pleuritic CP.  Does not have no significant risk factors for ACS.  Is low risk for PE.  She appears in NAD, is afebrile, VSS.  Work up initiated.    4:22 PM EKG shows no acute ischemic changes, normal troponin, chest x-ray without any acute finding. Low suspicion for ACS. She has a negative d-dimer, doubt PE. No signs of pneumonia, pneumothorax, or other acute emergent medical condition. Normal BNP, doubt CHF. At this time patient will benefit from further evaluation to her primary care Dr. which she agrees.  Plan to provide albuterol inhaler to use as needed. Care discussed with Dr. Jeneen Rinks.     Labs Review Labs Reviewed  BASIC METABOLIC PANEL - Abnormal; Notable for the following:    GFR calc non Af Amer 74 (*)    GFR calc Af Amer 86 (*)    All other components within normal limits  CBC  PRO B NATRIURETIC PEPTIDE  D-DIMER, QUANTITATIVE  POCT I-STAT TROPONIN I   Imaging Review Dg Chest 2 View  12/18/2013   CLINICAL DATA:  Chest pain  EXAM: CHEST  2 VIEW  COMPARISON:  12/02/2011  FINDINGS: Cardiomediastinal silhouette is stable. Mild elevation of the right hemidiaphragm. No acute infiltrate or pleural effusion. No pulmonary edema. Bony thorax is  unremarkable.  IMPRESSION: No active cardiopulmonary disease.   Electronically Signed   By: Lahoma Crocker M.D.   On: 12/18/2013 16:05    EKG Interpretation    Date/Time:  Wednesday December 18 2013 12:16:09 EST Ventricular Rate:  89 PR Interval:  138 QRS Duration: 77 QT Interval:  379 QTC Calculation: 461 R Axis:   66 Text Interpretation:  Sinus rhythm Low voltage, precordial leads No significant change since  last tracing Confirmed by Wilson Singer  MD, [place] [phone]) on 12/18/2013 12:18:41 PM            MDM   Final diagnoses:  Chest pain, pleuritic  Shortness of breath    BP 147/101  Pulse 88  Temp(Src) 97.7 F (36.5 C) (Oral)  Resp 22  SpO2 100%  I have reviewed nursing notes and vital signs. I personally reviewed the imaging tests through PACS system  I reviewed available ER/hospitalization records thought the EMR     Domenic Moras, [place] 12/18/13 1624

## 2013-12-18 NOTE — ED Notes (Signed)
Pt c/o increasing L side chest pain and SOB x 4 days.  Pain score 8/10.  Pt sts pain increases w/ deep breathing.

## 2013-12-27 ENCOUNTER — Encounter: Payer: Self-pay | Admitting: Neurology

## 2013-12-30 ENCOUNTER — Encounter: Payer: Self-pay | Admitting: Neurology

## 2013-12-30 ENCOUNTER — Ambulatory Visit (INDEPENDENT_AMBULATORY_CARE_PROVIDER_SITE_OTHER): Payer: 59 | Admitting: Neurology

## 2013-12-30 VITALS — BP 144/97 | HR 91 | Ht 68.0 in | Wt 194.0 lb

## 2013-12-30 DIAGNOSIS — R202 Paresthesia of skin: Secondary | ICD-10-CM

## 2013-12-30 DIAGNOSIS — G518 Other disorders of facial nerve: Secondary | ICD-10-CM

## 2013-12-30 DIAGNOSIS — M5030 Other cervical disc degeneration, unspecified cervical region: Secondary | ICD-10-CM

## 2013-12-30 DIAGNOSIS — R209 Unspecified disturbances of skin sensation: Secondary | ICD-10-CM

## 2013-12-30 DIAGNOSIS — G514 Facial myokymia: Secondary | ICD-10-CM

## 2013-12-30 MED ORDER — GABAPENTIN 300 MG PO CAPS: 300.0000 mg | ORAL_CAPSULE | Freq: Two times a day (BID) | ORAL | Status: DC

## 2013-12-30 NOTE — Progress Notes (Signed)
[facility]    Provider:  Dr Janann Colonel Referring Provider: Nena Jordan, Ibt* Primary Care Physician:  Nena Jordan, Mammie Lorenzo, MD  CC:  Facial twitching  HPI:  [name] is a 53 y.o. female here as a referral from Dr. Nena Jordan for facial twitching and cervical neck pain.   Facial twitching started a few months ago, involves right side of face. Mainly involves maxillary region and perioral. Has not happened in 2 weeks. Described as brief twitching episodes lasting a few minutes occuring multiple times throughout the day. No forced eye lid closure. No triggering factors, no alleviating factors. No sensory change in the face. Does note some tingling and paresthesias in bilateral hands. No vision changes. No recent fever, illnesses, colds. No known rashes, no tic bites.  Notes chronic cervical neck pain, had recent MRI showing disc bulge at C5-6. No weakness in extremities. No gait instability. No change in bowel/bladder.   Overall healthy, no other health concerns at this time. No known family neurological disorders. Family history of RA but no autoimmune disorders.  MRI brain and C spine imaging reviewed.   Review of Systems: Out of a complete 14 system review, the patient complains of only the following symptoms, and all other reviewed systems are negative. + for headache, numbness, weakness, joint pain, aching muscles  History   Social History  . Marital Status: Legally Separated    Spouse Name: N/A    Number of Children: 2  . Years of Education: college   Occupational History  .  [employer]   Social History Main Topics  . Smoking status: Never Smoker   . Smokeless tobacco: Never Used     Comment: Divorced, lives alone -  . Alcohol Use: Yes     Comment: occ  . Drug Use: No  . Sexual Activity: Yes    Birth Control/ Protection: None, Surgical   Other Topics Concern  . Not on file   Social History Narrative   Patient is single, has 2 children   Patient is right handed    Education level is some college   Caffeine consumption 1 cup daily    Family History  Problem Relation Age of Onset  . Arthritis Other     grandparents  . Breast cancer Other     other relative  . Diabetes Other     grandparents  . Hyperlipidemia Other     grandparents  . Hypertension Other     grandparent & parents  . Thyroid disease Brother   . Transient ischemic attack Sister     Past Medical History  Diagnosis Date  . LIPOMA     R shoulder s/p excision 12/2010  . HYPERLIPIDEMIA     TGs, never on med rx  . ANEMIA-NOS   . HYPERTENSION   . ALLERGIC RHINITIS   . GERD   . Headache(784.0)     chronic  . Thyroid nodule     stable [place] 09/2009 and 11/2011    Past Surgical History  Procedure Laterality Date  . Abdominal hysterectomy  2007  . (r) shoulder lipoma excision  12/2010    [facility]    Current Outpatient Prescriptions  Medication Sig Dispense Refill  . acetaminophen (TYLENOL) 500 MG tablet Take 1,000 mg by mouth every 6 (six) hours as needed for mild pain or moderate pain.      . cetirizine (ZYRTEC) 10 MG tablet Take 10 mg by mouth daily.      Marland Kitchen  fluticasone (FLONASE) 50 MCG/ACT nasal spray Place 1 spray into both nostrils daily.      Marland Kitchen loratadine (CLARITIN) 10 MG tablet Take 10 mg by mouth daily.      . Multiple Vitamin (MULTIVITAMIN) tablet Take 1 tablet by mouth daily.      Marland Kitchen omeprazole (PRILOSEC) 20 MG capsule Take 1 capsule (20 mg total) by mouth daily.  30 capsule  11  . ranitidine (ZANTAC) 150 MG tablet Take 150 mg by mouth 2 (two) times daily.      . SUMAtriptan (IMITREX) 100 MG tablet Take 100 mg by mouth every 2 (two) hours as needed for migraine or headache. May repeat in 2 hours if headache persists or recurs.       No current facility-administered medications for this visit.    Allergies as of 12/30/2013 - Review Complete 12/30/2013  Allergen Reaction Noted  . Imodium [loperamide]  Anaphylaxis 07/15/2012  . Peanut-containing drug products Other (See Comments) 12/30/2013  . Penicillins Hives 12/23/2010  . Codeine Rash 12/27/2013  . Doxycycline Rash   . Percocet [oxycodone-acetaminophen] Rash 07/15/2012    Vitals: BP 144/97  Pulse 91  Ht 5\' 8"  (1.727 m)  Wt 194 lb (87.998 kg)  BMI 29.50 kg/m2 Last Weight:  Wt Readings from Last 1 Encounters:  12/30/13 194 lb (87.998 kg)   Last Height:   Ht Readings from Last 1 Encounters:  12/30/13 5\' 8"  (1.727 m)     Physical exam: Exam: Gen: NAD, conversant Eyes: anicteric sclerae, moist conjunctivae HENT: Atraumatic, oropharynx clear, no facial twitching noted, decreased ROM in neck with rotation Neck: Trachea midline; supple,  Lungs: CTA, no wheezing, rales, rhonic                          CV: RRR, no MRG Abdomen: Soft, non-tender;  Extremities: No peripheral edema  Skin: Normal temperature, no rash,  Psych: Appropriate affect, pleasant  Neuro: MS: AA&Ox3, appropriately interactive, normal affect   Speech: fluent w/o paraphasic error  Memory: good recent and remote recall  CN: PERRL, EOMI no nystagmus, no ptosis, sensation intact to LT V1-V3 bilat, face symmetric, no weakness, hearing grossly intact, palate elevates symmetrically, shoulder shrug 5/5 bilat,  tongue protrudes midline, no fasiculations noted.  Motor: normal bulk and tone Strength: 5/5  In all extremities  Coord: rapid alternating and point-to-point (FNF, HTS) movements intact.  Reflexes: symmetrical, bilat downgoing toes  Sens: decreased LT in bilateral hands in median distribution, + Phalens sign  Gait: posture, stance, stride and arm-swing normal. Tandem gait intact. Able to walk on heels and toes. Romberg absent.   Assessment:  After physical and neurologic examination, review of laboratory studies, imaging, neurophysiology testing and pre-existing records, assessment will be reviewed on the problem list.  Plan:  Treatment plan  and additional workup will be reviewed under Problem List.  1)Hemifacial spasm 2)Cervical neck pain 3)Paresthesias  53y/o woman presenting for initial evaluation of right sided facial twitching, cervical neck pain and UE paresthesias. Facial twitching has resolved but based on description is most consistent with a diagnosis of hemifacial spasm. Unclear etiology, MRI brain unremarkable. As symptoms have resolved will check lab work and monitor for now. Patient notes her main concern is cervical neck pain, likely secondary to disc bulge. Will start neurontin 300mg  BID and refer to PT. If no improvement would consider referral for possible steroid injections. Will check EMG/NCS for possible CTS based on history and exam findings. Follow up  once workup completed.   Jim Like, DO  [facility] [address] [place], [place] [address]  Phone [phone] Fax [phone]

## 2013-12-30 NOTE — Patient Instructions (Signed)
Overall you are doing fairly well but I do want to suggest a few things today:   As far as your medications are concerned, I would like to suggest the following: 1)Start taking gabapentin 300mg  twice a day  As far as diagnostic testing:  1)Please schedule an EMg/Nerve Conduction study 2)When you return for this test, please have blood work completed  I placed a referral for physical therapy, you will be called to schedule this  Follow up as needed. Please call us with any interim questions, concerns, problems, updates or refill requests.   Please also call us for any test results so we can go over those with you on the phone.  My clinical assistant and will answer any of your questions and relay your messages to me and also relay most of my messages to you.   Our phone number is [phone]. We also have an after hours call service for urgent matters and there is a physician on-call for urgent questions. For any emergencies you know to call 911 or go to the nearest emergency room

## 2013-12-31 ENCOUNTER — Encounter (HOSPITAL_COMMUNITY)
Admission: RE | Disposition: A | Discharge status: Home / Self Care | Payer: Self-pay | Source: Ambulatory Visit | Attending: Gastroenterology

## 2013-12-31 ENCOUNTER — Ambulatory Visit (HOSPITAL_COMMUNITY)
Admission: RE | Admit: 2013-12-31 | Discharge: 2013-12-31 | Disposition: A | Discharge status: Home / Self Care | Payer: Commercial Managed Care - PPO | Source: Ambulatory Visit | Attending: Gastroenterology | Admitting: Gastroenterology

## 2013-12-31 ENCOUNTER — Encounter (HOSPITAL_COMMUNITY): Payer: Self-pay | Admitting: *Deleted

## 2013-12-31 DIAGNOSIS — K219 Gastro-esophageal reflux disease without esophagitis: Secondary | ICD-10-CM | POA: Insufficient documentation

## 2013-12-31 DIAGNOSIS — Z1211 Encounter for screening for malignant neoplasm of colon: Secondary | ICD-10-CM | POA: Insufficient documentation

## 2013-12-31 DIAGNOSIS — Z885 Allergy status to narcotic agent status: Secondary | ICD-10-CM | POA: Insufficient documentation

## 2013-12-31 DIAGNOSIS — G43909 Migraine, unspecified, not intractable, without status migrainosus: Secondary | ICD-10-CM | POA: Insufficient documentation

## 2013-12-31 DIAGNOSIS — D378 Neoplasm of uncertain behavior of other specified digestive organs: Secondary | ICD-10-CM

## 2013-12-31 DIAGNOSIS — D375 Neoplasm of uncertain behavior of rectum: Secondary | ICD-10-CM

## 2013-12-31 DIAGNOSIS — Z888 Allergy status to other drugs, medicaments and biological substances status: Secondary | ICD-10-CM | POA: Insufficient documentation

## 2013-12-31 DIAGNOSIS — M1990 Unspecified osteoarthritis, unspecified site: Secondary | ICD-10-CM | POA: Insufficient documentation

## 2013-12-31 DIAGNOSIS — Z90710 Acquired absence of both cervix and uterus: Secondary | ICD-10-CM | POA: Insufficient documentation

## 2013-12-31 DIAGNOSIS — J309 Allergic rhinitis, unspecified: Secondary | ICD-10-CM | POA: Insufficient documentation

## 2013-12-31 DIAGNOSIS — E042 Nontoxic multinodular goiter: Secondary | ICD-10-CM | POA: Insufficient documentation

## 2013-12-31 DIAGNOSIS — D371 Neoplasm of uncertain behavior of stomach: Secondary | ICD-10-CM | POA: Insufficient documentation

## 2013-12-31 DIAGNOSIS — Z880 Allergy status to penicillin: Secondary | ICD-10-CM | POA: Insufficient documentation

## 2013-12-31 HISTORY — PX: COLONOSCOPY: SHX5424

## 2013-12-31 SURGERY — COLONOSCOPY
Anesthesia: Moderate Sedation

## 2013-12-31 MED ORDER — FENTANYL CITRATE 0.05 MG/ML IJ SOLN
INTRAMUSCULAR | Status: DC | PRN
  Administered 2013-12-31 (×2): 25 ug via INTRAVENOUS
  Administered 2013-12-31: 50 ug via INTRAVENOUS

## 2013-12-31 MED ORDER — MIDAZOLAM HCL 5 MG/5ML IJ SOLN
INTRAMUSCULAR | Status: DC | PRN
  Administered 2013-12-31 (×4): 2.5 mg via INTRAVENOUS

## 2013-12-31 MED ORDER — DIPHENHYDRAMINE HCL 50 MG/ML IJ SOLN
INTRAMUSCULAR | Status: DC | PRN
  Administered 2013-12-31: 12.5 mg via INTRAVENOUS

## 2013-12-31 MED ORDER — FENTANYL CITRATE 0.05 MG/ML IJ SOLN
INTRAMUSCULAR | Status: AC
  Filled 2013-12-31: qty 2

## 2013-12-31 MED ORDER — MIDAZOLAM HCL 10 MG/2ML IJ SOLN
INTRAMUSCULAR | Status: AC
  Filled 2013-12-31: qty 4

## 2013-12-31 MED ORDER — DIPHENHYDRAMINE HCL 50 MG/ML IJ SOLN
INTRAMUSCULAR | Status: AC
  Filled 2013-12-31: qty 1

## 2013-12-31 MED ORDER — SODIUM CHLORIDE 0.9 % IV SOLN
INTRAVENOUS | Status: DC
  Administered 2013-12-31: 500 mL via INTRAVENOUS

## 2013-12-31 NOTE — H&P (Signed)
  Procedure: Baseline screening colonoscopy  History: The patient is a 53 year old female born 02/13/1961. She is scheduled to undergo her first screening colonoscopy with polypectomy to prevent colon cancer.  Past medical history: Gastroesophageal reflux. Migraine headache syndrome. Osteoarthritis. Allergic rhinitis. Thyroid nodules. Hysterectomy.  Medication allergies: Penicillin. Codeine. Imodium. Doxycycline.  Exam: The patient is alert and lying comfortably on the endoscopy stretcher. Abdomen is soft and nontender to palpation. Lungs are clear to auscultation. Cardiac exam reveals a regular rhythm.  Plan: Proceed with screening colonoscopy.

## 2013-12-31 NOTE — Op Note (Signed)
Procedure: Baseline screening colonoscopy  Endoscopist: Earle Gell  Premedication: Benadryl 12.5 mg. Versed 10 mg. Fentanyl 100 mcg.  Procedure: The patient was placed in the left lateral decubitus position. Anal inspection and digital rectal exam were normal. The Pentax pediatric colonoscope was introduced into the rectum and advanced to the cecum. A normal-appearing appendiceal orifice and ileocecal valve were identified. Colonic preparation for the exam today was good.  Rectum. Normal. Retroflexed view of the distal rectum normal  Sigmoid colon. From the distal sigmoid colon a 3 mm sessile polyp was removed with the cold biopsy forceps and submitted for pathological interpretation.  Descending colon. Normal  Splenic flexure. Normal  Transverse colon. Normal  Hepatic flexure. Normal  Ascending colon. Normal  Cecum and ileocecal valve. Normal  Assessment: From the distal sigmoid colon, a 3 mm sessile polyp was removed with the cold biopsy forceps. Otherwise normal proctocolonoscopy to the cecum.  Recommendations: If the sigmoid colon polyp returns neoplastic pathologically, the patient should undergo a surveillance colonoscopy in 5 years. If the sigmoid colon polyp returns nonneoplastic pathologically, the patient should undergo a repeat screening colonoscopy in 10 years.

## 2013-12-31 NOTE — Discharge Instructions (Signed)
Colonoscopy °Care After °These instructions give you information on caring for yourself after your procedure. Your doctor may also give you more specific instructions. Call your doctor if you have any problems or questions after your procedure. °HOME CARE °· Take it easy for the next 24 hours. °· Rest. °· Walk or use warm packs on your belly (abdomen) if you have belly cramping or gas. °· Do not drive for 24 hours. °· You may shower. °· Do not sign important papers or use machinery for 24 hours. °· Drink enough fluids to keep your pee (urine) clear or pale yellow. °· Resume your normal diet. Avoid heavy or fried foods. °· Avoid alcohol. °· Continue taking your normal medicines. °· Only take medicine as told by your doctor. Do not take aspirin. °If you had growths (polyps) removed: °· Do not take aspirin. °· Do not drink alcohol for 7 days or as told by your doctor. °· Eat a soft diet for 24 hours. °GET HELP RIGHT AWAY IF: °· You have a fever. °· You pass clumps of tissue (blood clots) or fill the toilet with blood. °· You have belly pain that gets worse and medicine does not help. °· Your belly is puffy (swollen). °· You feel sick to your stomach (nauseous) or throw up (vomit). °MAKE SURE YOU: °· Understand these instructions. °· Will watch your condition. °· Will get help right away if you are not doing well or get worse. °Document Released: 11/26/2010 Document Revised: 01/16/2012 Document Reviewed: 07/01/2013 °[employer]® Patient Information ©2014 [employer]. ° °

## 2014-01-01 ENCOUNTER — Encounter (HOSPITAL_COMMUNITY): Payer: Self-pay | Admitting: Gastroenterology

## 2014-01-15 ENCOUNTER — Telehealth: Payer: Self-pay | Admitting: Neurology

## 2014-01-15 NOTE — Telephone Encounter (Signed)
Called patient and patient stated that she would like for her lab orders to be sent to her, so she can have them done somewhere else. I informed the patient if she could have them fax over the results to Dr. Janann Colonel, we would appreciate it. I faxed orders to fax# [phone] and got a conformation. I advised the patient that if she has any other problems, questions or concerns to call the office. Patient verbalized understanding.

## 2014-01-15 NOTE — Telephone Encounter (Signed)
Patient calling to request if her labwork can be sent over to her work so she can have it drawn there. Please call and advise patient.

## 2014-01-17 ENCOUNTER — Encounter: Payer: 59 | Admitting: Neurology

## 2014-03-05 ENCOUNTER — Ambulatory Visit: Payer: Commercial Managed Care - PPO | Attending: Neurology

## 2014-03-05 DIAGNOSIS — M25519 Pain in unspecified shoulder: Secondary | ICD-10-CM | POA: Insufficient documentation

## 2014-03-05 DIAGNOSIS — IMO0001 Reserved for inherently not codable concepts without codable children: Secondary | ICD-10-CM | POA: Insufficient documentation

## 2014-03-05 DIAGNOSIS — R5381 Other malaise: Secondary | ICD-10-CM | POA: Insufficient documentation

## 2014-03-05 DIAGNOSIS — M542 Cervicalgia: Secondary | ICD-10-CM | POA: Insufficient documentation

## 2014-03-18 ENCOUNTER — Ambulatory Visit: Payer: Commercial Managed Care - PPO | Attending: Neurology | Admitting: Physical Therapy

## 2014-03-18 DIAGNOSIS — IMO0001 Reserved for inherently not codable concepts without codable children: Secondary | ICD-10-CM | POA: Insufficient documentation

## 2014-03-18 DIAGNOSIS — M25519 Pain in unspecified shoulder: Secondary | ICD-10-CM | POA: Insufficient documentation

## 2014-03-18 DIAGNOSIS — R5381 Other malaise: Secondary | ICD-10-CM | POA: Insufficient documentation

## 2014-03-18 DIAGNOSIS — M542 Cervicalgia: Secondary | ICD-10-CM | POA: Insufficient documentation

## 2014-03-20 ENCOUNTER — Ambulatory Visit: Payer: Commercial Managed Care - PPO | Admitting: Physical Therapy

## 2014-03-25 ENCOUNTER — Ambulatory Visit: Payer: Commercial Managed Care - PPO | Admitting: Physical Therapy

## 2014-03-27 ENCOUNTER — Ambulatory Visit: Payer: Commercial Managed Care - PPO | Admitting: Physical Therapy

## 2014-04-01 ENCOUNTER — Ambulatory Visit: Payer: Commercial Managed Care - PPO | Admitting: Physical Therapy

## 2014-04-03 ENCOUNTER — Ambulatory Visit: Payer: Commercial Managed Care - PPO | Admitting: Physical Therapy

## 2014-04-10 ENCOUNTER — Ambulatory Visit: Payer: Commercial Managed Care - PPO | Attending: Neurology | Admitting: Physical Therapy

## 2014-04-10 DIAGNOSIS — M25519 Pain in unspecified shoulder: Secondary | ICD-10-CM | POA: Insufficient documentation

## 2014-04-10 DIAGNOSIS — M542 Cervicalgia: Secondary | ICD-10-CM | POA: Insufficient documentation

## 2014-04-10 DIAGNOSIS — IMO0001 Reserved for inherently not codable concepts without codable children: Secondary | ICD-10-CM | POA: Insufficient documentation

## 2014-04-10 DIAGNOSIS — R5381 Other malaise: Secondary | ICD-10-CM | POA: Insufficient documentation

## 2014-04-17 ENCOUNTER — Ambulatory Visit: Payer: Commercial Managed Care - PPO | Admitting: Physical Therapy

## 2014-05-01 ENCOUNTER — Ambulatory Visit: Payer: Commercial Managed Care - PPO

## 2014-05-08 ENCOUNTER — Ambulatory Visit: Payer: Commercial Managed Care - PPO | Admitting: Physical Therapy

## 2014-05-15 ENCOUNTER — Ambulatory Visit: Payer: Commercial Managed Care - PPO | Attending: Neurology | Admitting: Physical Therapy

## 2014-05-15 DIAGNOSIS — M25519 Pain in unspecified shoulder: Secondary | ICD-10-CM | POA: Diagnosis not present

## 2014-05-15 DIAGNOSIS — M542 Cervicalgia: Secondary | ICD-10-CM | POA: Diagnosis not present

## 2014-05-15 DIAGNOSIS — IMO0001 Reserved for inherently not codable concepts without codable children: Secondary | ICD-10-CM | POA: Diagnosis not present

## 2014-05-15 DIAGNOSIS — R5381 Other malaise: Secondary | ICD-10-CM | POA: Diagnosis not present

## 2014-05-22 ENCOUNTER — Ambulatory Visit: Payer: Commercial Managed Care - PPO | Admitting: Physical Therapy

## 2014-05-29 ENCOUNTER — Ambulatory Visit: Payer: Commercial Managed Care - PPO | Admitting: Physical Therapy

## 2014-06-12 NOTE — Telephone Encounter (Signed)
Noted  

## 2014-07-13 ENCOUNTER — Encounter (HOSPITAL_COMMUNITY): Payer: Self-pay | Admitting: Emergency Medicine

## 2014-07-13 ENCOUNTER — Emergency Department (HOSPITAL_COMMUNITY)
Admission: EM | Admit: 2014-07-13 | Discharge: 2014-07-14 | Disposition: A | Discharge status: Home / Self Care | Payer: Commercial Managed Care - PPO | Attending: Emergency Medicine | Admitting: Emergency Medicine

## 2014-07-13 DIAGNOSIS — G43009 Migraine without aura, not intractable, without status migrainosus: Secondary | ICD-10-CM

## 2014-07-13 DIAGNOSIS — I10 Essential (primary) hypertension: Secondary | ICD-10-CM | POA: Diagnosis not present

## 2014-07-13 DIAGNOSIS — Z880 Allergy status to penicillin: Secondary | ICD-10-CM | POA: Diagnosis not present

## 2014-07-13 DIAGNOSIS — R079 Chest pain, unspecified: Secondary | ICD-10-CM

## 2014-07-13 DIAGNOSIS — G8929 Other chronic pain: Secondary | ICD-10-CM | POA: Insufficient documentation

## 2014-07-13 DIAGNOSIS — K219 Gastro-esophageal reflux disease without esophagitis: Secondary | ICD-10-CM | POA: Insufficient documentation

## 2014-07-13 DIAGNOSIS — Z872 Personal history of diseases of the skin and subcutaneous tissue: Secondary | ICD-10-CM | POA: Diagnosis not present

## 2014-07-13 DIAGNOSIS — Z8709 Personal history of other diseases of the respiratory system: Secondary | ICD-10-CM | POA: Insufficient documentation

## 2014-07-13 DIAGNOSIS — IMO0002 Reserved for concepts with insufficient information to code with codable children: Secondary | ICD-10-CM | POA: Diagnosis not present

## 2014-07-13 DIAGNOSIS — R51 Headache: Secondary | ICD-10-CM | POA: Insufficient documentation

## 2014-07-13 DIAGNOSIS — Z862 Personal history of diseases of the blood and blood-forming organs and certain disorders involving the immune mechanism: Secondary | ICD-10-CM | POA: Insufficient documentation

## 2014-07-13 DIAGNOSIS — Z8639 Personal history of other endocrine, nutritional and metabolic disease: Secondary | ICD-10-CM | POA: Insufficient documentation

## 2014-07-13 DIAGNOSIS — Z79899 Other long term (current) drug therapy: Secondary | ICD-10-CM | POA: Diagnosis not present

## 2014-07-13 NOTE — ED Notes (Signed)
Pt arrived to the Ed with a complaint of chest pain and a headache.  Pt states headache started yesterday and when the pt woke up she had chest pain.  Pt states that the pain s a pressure that radiates down her left arm.

## 2014-07-14 ENCOUNTER — Emergency Department (HOSPITAL_COMMUNITY): Payer: Commercial Managed Care - PPO

## 2014-07-14 LAB — BASIC METABOLIC PANEL
Anion gap: 19 — ABNORMAL HIGH (ref 5–15)
BUN: 17 mg/dL (ref 6–23)
CO2: 21 meq/L (ref 19–32)
CREATININE: 1.17 mg/dL — AB (ref 0.50–1.10)
Calcium: 9.7 mg/dL (ref 8.4–10.5)
Chloride: 100 mEq/L (ref 96–112)
GFR calc Af Amer: 61 mL/min — ABNORMAL LOW (ref >90)
GFR calc non Af Amer: 53 mL/min — ABNORMAL LOW (ref >90)
Glucose, Bld: 93 mg/dL (ref 70–99)
Potassium: 3.5 mEq/L — ABNORMAL LOW (ref 3.7–5.3)
Sodium: 140 mEq/L (ref 137–147)

## 2014-07-14 LAB — CBC
HEMATOCRIT: 37.8 % (ref 36.0–46.0)
Hemoglobin: 12.7 g/dL (ref 12.0–15.0)
MCH: 26.9 pg (ref 26.0–34.0)
MCHC: 33.6 g/dL (ref 30.0–36.0)
MCV: 80.1 fL (ref 78.0–100.0)
Platelets: 198 10*3/uL (ref 150–400)
RBC: 4.72 MIL/uL (ref 3.87–5.11)
RDW: 14.5 % (ref 11.5–15.5)
WBC: 5.7 10*3/uL (ref 4.0–10.5)

## 2014-07-14 LAB — I-STAT TROPONIN, ED: Troponin i, poc: 0.01 ng/mL (ref 0.00–0.08)

## 2014-07-14 LAB — D-DIMER, QUANTITATIVE (NOT AT ARMC): D DIMER QUANT: 0.3 ug{FEU}/mL (ref 0.00–0.48)

## 2014-07-14 MED ORDER — DIPHENHYDRAMINE HCL 50 MG/ML IJ SOLN
25.0000 mg | Freq: Once | INTRAMUSCULAR | Status: AC
  Administered 2014-07-14: 25 mg via INTRAVENOUS
  Filled 2014-07-14: qty 1

## 2014-07-14 MED ORDER — SODIUM CHLORIDE 0.9 % IV BOLUS (SEPSIS)
1000.0000 mL | Freq: Once | INTRAVENOUS | Status: AC
  Administered 2014-07-14: 1000 mL via INTRAVENOUS

## 2014-07-14 MED ORDER — KETOROLAC TROMETHAMINE 30 MG/ML IJ SOLN
30.0000 mg | Freq: Once | INTRAMUSCULAR | Status: AC
  Administered 2014-07-14: 30 mg via INTRAVENOUS
  Filled 2014-07-14: qty 1

## 2014-07-14 MED ORDER — METOCLOPRAMIDE HCL 5 MG/ML IJ SOLN
10.0000 mg | Freq: Once | INTRAMUSCULAR | Status: AC
  Administered 2014-07-14: 10 mg via INTRAVENOUS
  Filled 2014-07-14: qty 2

## 2014-07-14 NOTE — ED Provider Notes (Signed)
CSN: [id number]     Arrival date & time 07/13/14  2225 History   First MD Initiated Contact with Patient 07/13/14 2337     Chief Complaint  Patient presents with  . Chest Pain  . Headache     (Consider location/radiation/quality/duration/timing/severity/associated sxs/prior Treatment) HPI Pt presents with c/o left sided throbbing headache which has been present for the past 3 days, headache has been constant.  Today she also felt that she had chest pain in the center of her chest- worse when lying flat tonight in conjunction with the bad headache.  No vomiting.  No fever/chills.  No difficulty breathing.  No leg swelling.  No hx of DVT/PE, no recent travel/trauma/surgery or hormone replacements.  She has not tired anything for her symptoms at home.  No neck pain.  Her primary complaint is the headache.  She has hx of HTN, has had prior ED visits for chest pain, but no definitive cardiac testing or followup as an outpatient for this chest pain.  There are no other associated systemic symptoms, there are no other alleviating or modifying factors.   Past Medical History  Diagnosis Date  . LIPOMA     R shoulder s/p excision 12/2010  . HYPERLIPIDEMIA     TGs, never on med rx  . ANEMIA-NOS   . HYPERTENSION   . ALLERGIC RHINITIS   . GERD   . Headache(784.0)     chronic  . Thyroid nodule     stable [place] 09/2009 and 11/2011   Past Surgical History  Procedure Laterality Date  . Abdominal hysterectomy  2007  . (r) shoulder lipoma excision  12/2010    [facility]  . Colonoscopy N/A 12/31/2013    Procedure: COLONOSCOPY;  Surgeon: Garlan Fair, MD;  Location: [facility];  Service: Endoscopy;  Laterality: N/A;   Family History  Problem Relation Age of Onset  . Arthritis Other     grandparents  . Breast cancer Other     other relative  . Diabetes Other     grandparents  . Hyperlipidemia Other     grandparents  . Hypertension Other     grandparent & parents  . Thyroid disease Brother    . Transient ischemic attack Sister    History  Substance Use Topics  . Smoking status: Never Smoker   . Smokeless tobacco: Never Used     Comment: Divorced, lives alone -  . Alcohol Use: Yes     Comment: occ   OB History   Grav Para Term Preterm Abortions TAB SAB Ect Mult Living   2 2 2       2      Review of Systems ROS reviewed and all otherwise negative except for mentioned in HPI    Allergies  Imodium; Peanut-containing drug products; Penicillins; Codeine; Doxycycline; and Percocet  Home Medications   Prior to Admission medications   Medication Sig Start Date End Date Taking? Authorizing Provider  acetaminophen (TYLENOL) 500 MG tablet Take 1,000 mg by mouth every 6 (six) hours as needed for mild pain or moderate pain.   Yes Historical Provider, MD  cetirizine (ZYRTEC) 10 MG tablet Take 10 mg by mouth daily.   Yes Historical Provider, MD  diphenhydrAMINE (BENADRYL) 25 mg capsule Take 25 mg by mouth every 6 (six) hours as needed for allergies.   Yes Historical Provider, MD  fluticasone (FLONASE) 50 MCG/ACT nasal spray Place 1 spray into both nostrils daily.   Yes Historical Provider, MD  loratadine (CLARITIN)  10 MG tablet Take 10 mg by mouth daily.   Yes Historical Provider, MD  Multiple Vitamin (MULTIVITAMIN) tablet Take 1 tablet by mouth daily.   Yes Historical Provider, MD  omeprazole (PRILOSEC) 20 MG capsule Take 1 capsule (20 mg total) by mouth daily. 10/11/12  Yes Rowe Clack, MD  ranitidine (ZANTAC) 150 MG tablet Take 150 mg by mouth 2 (two) times daily as needed for heartburn.     Historical Provider, MD  SUMAtriptan (IMITREX) 100 MG tablet Take 100 mg by mouth every 2 (two) hours as needed for migraine or headache. May repeat in 2 hours if headache persists or recurs.    Historical Provider, MD   BP 138/86  Pulse 86  Temp(Src) 98.2 F (36.8 C) (Oral)  Resp 19  SpO2 100% Vitals reviewed Physical Exam Physical Examination: General appearance - alert, well  appearing, and in no distress Mental status - alert, oriented to person, place, and time Eyes - pupils equal and reactive, extraocular eye movements intact Mouth - mucous membranes moist, pharynx normal without lesions Chest - clear to auscultation, no wheezes, rales or rhonchi, symmetric air entry Heart - normal rate, regular rhythm, normal S1, S2, no murmurs, rubs, clicks or gallops Abdomen - soft, nontender, nondistended, no masses or organomegaly Neurological - alert, orientedx 3, cranial nerves 2-12 tested and intact, strength 5/5 in extremities x 4, sensation intact Extremities - peripheral pulses normal, no pedal edema, no clubbing or cyanosis Skin - normal coloration and turgor, no rashes  ED Course  Procedures (including critical care time)  2:55 AM pt feels much improved after meds, no further headache. I have discussed results with her and she is agreeable with plan for discharge.   Labs Review Labs Reviewed  BASIC METABOLIC PANEL - Abnormal; Notable for the following:    Potassium 3.5 (*)    Creatinine, Ser 1.17 (*)    GFR calc non Af Amer 53 (*)    GFR calc Af Amer 61 (*)    Anion gap 19 (*)    All other components within normal limits  CBC  D-DIMER, QUANTITATIVE  I-STAT TROPOININ, ED    Imaging Review Dg Chest 2 View  07/14/2014   CLINICAL DATA:  Headache for 3 days, acute onset chest pain.  EXAM: CHEST  2 VIEW  COMPARISON:  Chest radiograph December 18, 2013  FINDINGS: Cardiomediastinal silhouette is unremarkable. The lungs are clear without pleural effusions or focal consolidations. Trachea projects midline and there is no pneumothorax. Soft tissue planes and included osseous structures are non-suspicious.  IMPRESSION: No active cardiopulmonary disease.   Electronically Signed   By: Elon Alas   On: 07/14/2014 01:01     EKG Interpretation   Date/Time:  Sunday July 13 2014 22:33:35 EDT Ventricular Rate:  88 PR Interval:  123 QRS Duration: 88 QT  Interval:  396 QTC Calculation: 479 R Axis:   61 Text Interpretation:  Sinus rhythm No significant change since last  tracing Confirmed by [facility]  MD, Deyani Hegarty [phone]) on 07/14/2014 12:04:34 AM      MDM   Final diagnoses:  Migraine without aura and without status migrainosus, not intractable  Chest pain, unspecified chest pain type    Pt presenting with migraine headache, gradual in onset, similar to prior migraines, normal neuro exam.  Doubt SAH or other emergent cause of headache.  Pt feels much improved with resolution of symptoms after migraine cocktail.  In association with headache pt feels chest pain with lying flat.  EKg and troponin negative, as well as d-dimer.  Low suspciion for ACS.  Per prior chart review patient has had similar workup for chest pain, needs followup as outpatient which she has not pursued as of yet.  Discharged with strict return precautions.  Pt agreeable with plan.  Prior records reviewed and considered during this visit Nursing notes including past medical history and social history reviewed and considered in documentation  Xray images reviewed and interpreted by me as well.      Threasa Beards, MD 07/14/14 [phone]

## 2014-07-14 NOTE — Discharge Instructions (Signed)
Return to the ED with any concerns including difficulty breathing, vomiting, changes in vision or speech, fainting, decreased level of alertness/lethargy, or any other alarming symptoms

## 2014-07-23 ENCOUNTER — Other Ambulatory Visit (HOSPITAL_COMMUNITY): Payer: Self-pay | Admitting: Internal Medicine

## 2014-07-23 ENCOUNTER — Telehealth (HOSPITAL_COMMUNITY): Payer: Self-pay | Admitting: *Deleted

## 2014-07-23 DIAGNOSIS — R079 Chest pain, unspecified: Secondary | ICD-10-CM

## 2014-07-31 ENCOUNTER — Ambulatory Visit (HOSPITAL_COMMUNITY)
Admission: RE | Admit: 2014-07-31 | Discharge: 2014-07-31 | Disposition: A | Discharge status: Home / Self Care | Payer: Commercial Managed Care - PPO | Source: Ambulatory Visit | Attending: Cardiovascular Disease | Admitting: Cardiovascular Disease

## 2014-07-31 DIAGNOSIS — R079 Chest pain, unspecified: Secondary | ICD-10-CM | POA: Insufficient documentation

## 2014-07-31 NOTE — Procedures (Signed)
Exercise Treadmill Test  Test  Exercise Tolerance Test Ordering MD: Collier Flowers, MD  Interpreting MD:   Unique Test No: 1  Treadmill:  1  Indication for ETT: chest pain - rule out ischemia  Contraindication to ETT: No   Stress Modality: exercise - treadmill  Cardiac Imaging Performed: non   Protocol: standard Bruce - maximal  Max BP:  185/104  Max MPHR (bpm):  169 85% MPR (bpm):  144  MPHR obtained (bpm):  176 % MPHR obtained:  104  Reached 85% MPHR (min:sec):  6:55 Total Exercise Time (min-sec):  7:44  Workload in METS:  9.60 Borg Scale:   Reason ETT Terminated:  chest-pressure-heaviness    ST Segment Analysis At Rest: normal ST segments - no evidence of significant ST depression With Exercise: no evidence of significant ST depression  Other Information Arrhythmia:  No Angina during ETT:  present (1) Quality of ETT:  diagnostic  ETT Interpretation:  normal - no evidence of ischemia by ST analysis  Comments: Test stopped due to 7/10 chest pain Hypertensive response to exercise No ischemic EKG changes noted Good exercise tolerance THR was achieved  Pixie Casino, MD, [facility] Attending Cardiologist [place]

## 2014-09-08 ENCOUNTER — Encounter (HOSPITAL_COMMUNITY): Payer: Self-pay | Admitting: Emergency Medicine

## 2014-10-16 ENCOUNTER — Other Ambulatory Visit: Payer: Self-pay

## 2014-10-16 DIAGNOSIS — Z1231 Encounter for screening mammogram for malignant neoplasm of breast: Secondary | ICD-10-CM

## 2014-11-17 ENCOUNTER — Ambulatory Visit
Admission: RE | Admit: 2014-11-17 | Discharge: 2014-11-17 | Disposition: A | Discharge status: Home / Self Care | Payer: Commercial Managed Care - PPO | Source: Ambulatory Visit

## 2014-11-17 DIAGNOSIS — Z1231 Encounter for screening mammogram for malignant neoplasm of breast: Secondary | ICD-10-CM

## 2015-01-21 ENCOUNTER — Ambulatory Visit (INDEPENDENT_AMBULATORY_CARE_PROVIDER_SITE_OTHER): Payer: Commercial Managed Care - PPO

## 2015-01-21 VITALS — BP 134/96 | HR 91 | Resp 16 | Ht 68.0 in | Wt 190.0 lb

## 2015-01-21 DIAGNOSIS — M79673 Pain in unspecified foot: Secondary | ICD-10-CM

## 2015-01-21 DIAGNOSIS — F430 Acute stress reaction: Secondary | ICD-10-CM

## 2015-01-21 DIAGNOSIS — M25373 Other instability, unspecified ankle: Secondary | ICD-10-CM

## 2015-01-21 DIAGNOSIS — M779 Enthesopathy, unspecified: Secondary | ICD-10-CM

## 2015-01-21 DIAGNOSIS — M129 Arthropathy, unspecified: Secondary | ICD-10-CM | POA: Diagnosis not present

## 2015-01-21 NOTE — Addendum Note (Signed)
Addended by: Lolita Rieger on: 01/21/2015 09:34 AM   Modules accepted: Orders

## 2015-01-21 NOTE — Progress Notes (Signed)
Subjective:    Patient ID: [name], female    DOB: 1961-08-04, 54 y.o.   MRN: [id number]  Foot Pain This is a chronic problem. The current episode started more than 1 year ago. The problem occurs 2 to 4 times per day. The problem has been gradually worsening. Associated symptoms include arthralgias, headaches and joint swelling. Associated symptoms comments: Sharp pain, pulling sensation. The symptoms are aggravated by standing and walking. She has tried acetaminophen, heat, ice, immobilization and rest (orthotics) for the symptoms. The treatment provided mild relief.   "My ankles just hurt.  They're bad.  I'm falling apart.  Dr. Gershon Mussel referred me.  He gave me braces and foot inserts for my shoes."    Review of Systems  HENT: Positive for sinus pressure.   Musculoskeletal: Positive for joint swelling and arthralgias.  Allergic/Immunologic: Positive for environmental allergies and food allergies.  Neurological: Positive for headaches.  All other systems reviewed and are negative.      Objective:   Physical Exam 54 year old [place] American female well-developed well-nourished oriented 3 presents at this time with greater than 1 or 2 year history of medial ankle pain. Patient describes pain along the medial or anterior to the medial malleolar area describes a pulling and tightness at times and pain at other times. Patient has had previous treatment steroid injections ankle stabilizer orthoses in an AFO device ice and NSAID therapies. Next  Lower extremity objective findings vascular status appears to be intact with pedal pulses palpable DP +2 over 4 PT 1 over 4 bilateral capillary refill time 3 seconds all digits epicritic and proprioceptive sensations intact and symmetric bilateral there is some hyperesthesia or pain on palpation along the medial malleolus bilateral right over the bone just superior to the ankle. There is also tenderness on palpation of the medial talar neck area  palpable no pain on palpation inferior to the malleolus or posterior to the malleolus patient does have some pain and discomfort elicited on inversion eversion dorsal flexion plantar flexion of the foot by try to rotate the foot she felt pain at times insert positions. On weightbearing patient has slightly promontory changes x-rays were reviewed of foot and ankle use bilateral reveal no signs of fracture no significant osseous abnormality rectus foot type mild rotatory changes with anterior breaks in the cyma line medial lateral ankles appear to be unremarkable no asymmetric joint space narrowing no fractures no cysts no tumors could be identified on plain radiographs. Clinically there is no significant swelling on the patient cases it swollen at this time no pitting or excessive edema identified no open wounds no lacerations no tissues identified all the patient Huston Foley that the swelling turn black at times. Muscle strength intact and symmetric bilateral dorsal flexion plantar flexors and inverters and everters patient is able stand up on her toes positive Jackson hoop shears test noted again reproducible pain on direct palpation of the tibia medial malleolus area and of the medial talar neck lateral ankle is completely pain-free       Assessment & Plan:  Assessment this time capsulitis medial ankle cannot rule out a stress reaction or stress fracture of some sort no cyst or tumor could be identified may be some mild ligament strain although it does not appear to be correlate with the 20 ligament or medial ankle capsule itself. Patient does indicate she wears high heels better 3 and she'll and flats curly wearing a very flat sandal although she has orthotics may not  be using them consistently. Patient does have a history of headaches does have history of anemia and hyperlipidemia as well as hypertension no other significant finding can be identified cannot rule out possibility of a seronegative arthropathy or  connective tissue disorder affecting her ankles.  This time patient was advised needed do a little additional investigation will request lab work for arthritis panel including ANA and a rheumatoid factor uric acid sedimentation rate and CRP. We'll also make arrangements for an MRI of both ankles medial malleolar areas to try to identify any osseous abnormalities soft tissue abnormalities that does not show on plain radiograph at this time. Patient be reevaluated within the next 2-3 weeks to review the results of MRI and her lab workup.  Harriet Masson DPM

## 2015-02-10 ENCOUNTER — Ambulatory Visit: Payer: Commercial Managed Care - PPO

## 2015-02-11 ENCOUNTER — Telehealth: Payer: Self-pay | Admitting: *Deleted

## 2015-02-11 NOTE — Telephone Encounter (Signed)
I initiated authorization process for MRI with and without contrast bilateral ankle.  Ulyess Mort said it was authorized.  Authorization number is [id number].  I left Pam at [facility] a message about the authorization of the MRI.

## 2015-02-13 ENCOUNTER — Ambulatory Visit (HOSPITAL_COMMUNITY)
Admission: RE | Admit: 2015-02-13 | Discharge: 2015-02-13 | Disposition: A | Discharge status: Home / Self Care | Payer: Commercial Managed Care - PPO | Source: Ambulatory Visit

## 2015-02-13 ENCOUNTER — Other Ambulatory Visit: Payer: Self-pay

## 2015-02-13 DIAGNOSIS — M779 Enthesopathy, unspecified: Secondary | ICD-10-CM

## 2015-02-13 DIAGNOSIS — M79673 Pain in unspecified foot: Secondary | ICD-10-CM

## 2015-02-13 DIAGNOSIS — M25572 Pain in left ankle and joints of left foot: Secondary | ICD-10-CM | POA: Diagnosis not present

## 2015-02-13 DIAGNOSIS — M129 Arthropathy, unspecified: Secondary | ICD-10-CM

## 2015-02-13 DIAGNOSIS — M25571 Pain in right ankle and joints of right foot: Secondary | ICD-10-CM | POA: Insufficient documentation

## 2015-02-13 DIAGNOSIS — F430 Acute stress reaction: Secondary | ICD-10-CM

## 2015-02-13 DIAGNOSIS — M25373 Other instability, unspecified ankle: Secondary | ICD-10-CM

## 2015-02-13 DIAGNOSIS — M659 Synovitis and tenosynovitis, unspecified: Secondary | ICD-10-CM | POA: Insufficient documentation

## 2015-02-13 DIAGNOSIS — S96812A Strain of other specified muscles and tendons at ankle and foot level, left foot, initial encounter: Secondary | ICD-10-CM | POA: Diagnosis not present

## 2015-02-13 DIAGNOSIS — S96811A Strain of other specified muscles and tendons at ankle and foot level, right foot, initial encounter: Secondary | ICD-10-CM | POA: Insufficient documentation

## 2015-02-13 DIAGNOSIS — X58XXXA Exposure to other specified factors, initial encounter: Secondary | ICD-10-CM | POA: Diagnosis not present

## 2015-02-16 ENCOUNTER — Encounter (HOSPITAL_COMMUNITY): Payer: Self-pay | Admitting: Emergency Medicine

## 2015-02-16 ENCOUNTER — Emergency Department (HOSPITAL_COMMUNITY)
Admission: EM | Admit: 2015-02-16 | Discharge: 2015-02-16 | Disposition: A | Discharge status: Home / Self Care | Payer: Commercial Managed Care - PPO | Attending: Emergency Medicine | Admitting: Emergency Medicine

## 2015-02-16 DIAGNOSIS — Z862 Personal history of diseases of the blood and blood-forming organs and certain disorders involving the immune mechanism: Secondary | ICD-10-CM | POA: Insufficient documentation

## 2015-02-16 DIAGNOSIS — Z8709 Personal history of other diseases of the respiratory system: Secondary | ICD-10-CM | POA: Diagnosis not present

## 2015-02-16 DIAGNOSIS — K219 Gastro-esophageal reflux disease without esophagitis: Secondary | ICD-10-CM | POA: Insufficient documentation

## 2015-02-16 DIAGNOSIS — L500 Allergic urticaria: Secondary | ICD-10-CM | POA: Diagnosis not present

## 2015-02-16 DIAGNOSIS — Z8572 Personal history of non-Hodgkin lymphomas: Secondary | ICD-10-CM | POA: Diagnosis not present

## 2015-02-16 DIAGNOSIS — Z8639 Personal history of other endocrine, nutritional and metabolic disease: Secondary | ICD-10-CM | POA: Diagnosis not present

## 2015-02-16 DIAGNOSIS — T7840XA Allergy, unspecified, initial encounter: Secondary | ICD-10-CM

## 2015-02-16 DIAGNOSIS — I10 Essential (primary) hypertension: Secondary | ICD-10-CM | POA: Insufficient documentation

## 2015-02-16 DIAGNOSIS — Z7951 Long term (current) use of inhaled steroids: Secondary | ICD-10-CM | POA: Insufficient documentation

## 2015-02-16 DIAGNOSIS — Z880 Allergy status to penicillin: Secondary | ICD-10-CM | POA: Diagnosis not present

## 2015-02-16 DIAGNOSIS — R0602 Shortness of breath: Secondary | ICD-10-CM | POA: Diagnosis present

## 2015-02-16 DIAGNOSIS — T781XXA Other adverse food reactions, not elsewhere classified, initial encounter: Secondary | ICD-10-CM | POA: Diagnosis not present

## 2015-02-16 DIAGNOSIS — L509 Urticaria, unspecified: Secondary | ICD-10-CM

## 2015-02-16 DIAGNOSIS — Z79899 Other long term (current) drug therapy: Secondary | ICD-10-CM | POA: Diagnosis not present

## 2015-02-16 MED ORDER — DEXAMETHASONE SODIUM PHOSPHATE 10 MG/ML IJ SOLN
10.0000 mg | Freq: Once | INTRAMUSCULAR | Status: AC
  Administered 2015-02-16: 10 mg via INTRAMUSCULAR
  Filled 2015-02-16: qty 1

## 2015-02-16 MED ORDER — DIPHENHYDRAMINE HCL 25 MG PO CAPS
50.0000 mg | ORAL_CAPSULE | Freq: Once | ORAL | Status: AC
  Administered 2015-02-16: 50 mg via ORAL
  Filled 2015-02-16: qty 2

## 2015-02-16 NOTE — ED Provider Notes (Signed)
CSN: [id number]     Arrival date & time 02/16/15  63 History   First MD Initiated Contact with Patient 02/16/15 1024     Chief Complaint  Patient presents with  . Allergic Reaction  . Shortness of Breath     (Consider location/radiation/quality/duration/timing/severity/associated sxs/prior Treatment) HPI Comments: 54 year old female with multiple allergies including peanuts, seasonal allergies, medicines presents with rash itchy gradually worsening for the past few days. Patient had possible exposure to nuts when she ate banana bread. Patient also felt this started after a new medication levocetirizine.  Currently no significant breathing problem no swelling of the tongue or throat, no other new medications. Rash on the anterior legs gradual worsening.  Patient is a 54 y.o. female presenting with allergic reaction and shortness of breath. The history is provided by the patient.  Allergic Reaction Presenting symptoms: rash   Shortness of Breath Associated symptoms: rash   Associated symptoms: no abdominal pain, no chest pain, no fever, no headaches, no neck pain and no vomiting     Past Medical History  Diagnosis Date  . LIPOMA     R shoulder s/p excision 12/2010  . HYPERLIPIDEMIA     TGs, never on med rx  . ANEMIA-NOS   . HYPERTENSION   . ALLERGIC RHINITIS   . GERD   . Headache(784.0)     chronic  . Thyroid nodule     stable [place] 09/2009 and 11/2011   Past Surgical History  Procedure Laterality Date  . Abdominal hysterectomy  2007  . (r) shoulder lipoma excision  12/2010    [facility]  . Colonoscopy N/A 12/31/2013    Procedure: COLONOSCOPY;  Surgeon: Garlan Fair, MD;  Location: [facility];  Service: Endoscopy;  Laterality: N/A;   Family History  Problem Relation Age of Onset  . Arthritis Other     grandparents  . Breast cancer Other     other relative  . Diabetes Other     grandparents  . Hyperlipidemia Other     grandparents  . Hypertension Other    grandparent & parents  . Thyroid disease Brother   . Transient ischemic attack Sister    History  Substance Use Topics  . Smoking status: Never Smoker   . Smokeless tobacco: Never Used     Comment: Divorced, lives alone -  . Alcohol Use: 0.0 oz/week    0 Standard drinks or equivalent per week     Comment: occ   OB History    Gravida Para Term Preterm AB TAB SAB Ectopic Multiple Living   2 2 2       2      Review of Systems  Constitutional: Negative for fever and chills.  Respiratory: Negative for shortness of breath.   Cardiovascular: Negative for chest pain.  Gastrointestinal: Negative for vomiting and abdominal pain.  Genitourinary: Negative for dysuria and flank pain.  Musculoskeletal: Negative for back pain, neck pain and neck stiffness.  Skin: Positive for rash.  Neurological: Negative for light-headedness and headaches.      Allergies  Imodium; Peanut-containing drug products; Penicillins; Codeine; Doxycycline; and Percocet  Home Medications   Prior to Admission medications   Medication Sig Start Date End Date Taking? Authorizing Provider  acetaminophen (TYLENOL) 500 MG tablet Take 1,000 mg by mouth every 6 (six) hours as needed for mild pain or moderate pain.   Yes Historical Provider, MD  cetirizine (ZYRTEC) 10 MG tablet Take 10 mg by mouth daily.   Yes Historical Provider,  MD  diphenhydrAMINE (BENADRYL) 25 mg capsule Take 25 mg by mouth every 6 (six) hours as needed for allergies.   Yes Historical Provider, MD  fluticasone (FLONASE) 50 MCG/ACT nasal spray Place 1 spray into both nostrils daily.   Yes Historical Provider, MD  levocetirizine (XYZAL) 5 MG tablet Take 5 mg by mouth at bedtime.   Yes Historical Provider, MD  loratadine (CLARITIN) 10 MG tablet Take 10 mg by mouth daily.   Yes Historical Provider, MD  Multiple Vitamin (MULTIVITAMIN) tablet Take 1 tablet by mouth daily.   Yes Historical Provider, MD  OLOPATADINE HCL NA Place 1 Squirt into the nose daily.    Yes Historical Provider, MD  omeprazole (PRILOSEC) 20 MG capsule Take 1 capsule (20 mg total) by mouth daily. 10/11/12  Yes Rowe Clack, MD  ranitidine (ZANTAC) 150 MG tablet Take 150 mg by mouth 2 (two) times daily as needed for heartburn.    Yes Historical Provider, MD  SUMAtriptan (IMITREX) 100 MG tablet Take 100 mg by mouth every 2 (two) hours as needed for migraine or headache. May repeat in 2 hours if headache persists or recurs.   Yes Historical Provider, MD  EPINEPHrine (EPIPEN IJ) Inject 1 Units as directed as needed.    Historical Provider, MD   BP 100/77 mmHg  Pulse 104  Temp(Src) 97.9 F (36.6 C) (Oral)  Resp 16  SpO2 99% Physical Exam  Constitutional: She is oriented to person, place, and time. She appears well-developed and well-nourished.  HENT:  Head: Normocephalic and atraumatic.  No angioedema, no stridor  Eyes: Right eye exhibits no discharge. Left eye exhibits no discharge.  Neck: Normal range of motion. Neck supple. No tracheal deviation present.  Cardiovascular: Normal rate and regular rhythm.   Pulmonary/Chest: Effort normal and breath sounds normal.  Musculoskeletal: She exhibits no edema.  Neurological: She is alert and oriented to person, place, and time.  Skin: Skin is warm. Rash noted.  Macular hive-like rash anterior thighs no evidence of infection  Psychiatric: She has a normal mood and affect.  Nursing note and vitals reviewed.   ED Course  Procedures (including critical care time) Labs Review Labs Reviewed - No data to display  Imaging Review No results found.   EKG Interpretation None      MDM   Final diagnoses:  Allergic reaction, initial encounter  Hives   Patient presents with worsening allergy type symptoms, no signs of angioedema. Decadron intramuscular and Benadryl. Discussed reasons to return.  Results and differential diagnosis were discussed with the patient/parent/guardian. Close follow up outpatient was discussed,  comfortable with the plan.   Medications  dexamethasone (DECADRON) injection 10 mg (not administered)  diphenhydrAMINE (BENADRYL) capsule 50 mg (not administered)    Filed Vitals:   02/16/15 1032 02/16/15 1100 02/16/15 1200 02/16/15 1202  BP: 150/95  100/77   Pulse: 101 99 104   Temp: 97.9 F (36.6 C)     TempSrc: Oral     Resp: 18   16  SpO2: 100% 100% 99%     Final diagnoses:  Allergic reaction, initial encounter  Hives       Elnora Morrison, MD 02/16/15 1227

## 2015-02-16 NOTE — ED Notes (Signed)
Pt c/o allergic reaction, began with leg rash 7 days ago, woke up with worsening of rash at 0200 today, rash progressed to arms, upper lip is swollen, and pt c/o SOB, cough developed in last 20 minutes. Pt denies swelling in throat, no apparent swelling to throat, tongue, or uvula at this time. Pt in control of secretions and able to speak without difficulty at this time. Pt has peanut allergy and began taking new allergy medication last Monday. Pt unsure of what medication is, pt's family member is en route bringing medication.

## 2015-02-16 NOTE — Discharge Instructions (Signed)
Take Benadryl every 6 hours as needed for itching. Return to the ER for significant breathing difficulty, throat or tongue swelling or worsening symptoms.  If you were given medicines take as directed.  If you are on coumadin or contraceptives realize their levels and effectiveness is altered by many different medicines.  If you have any reaction (rash, tongues swelling, other) to the medicines stop taking and see a physician.   Please follow up as directed and return to the ER or see a physician for new or worsening symptoms.  Thank you. Filed Vitals:   02/16/15 1032 02/16/15 1100 02/16/15 1200 02/16/15 1202  BP: 150/95  100/77   Pulse: 101 99 104   Temp: 97.9 F (36.6 C)     TempSrc: Oral     Resp: 18   16  SpO2: 100% 100% 99%

## 2015-02-24 ENCOUNTER — Ambulatory Visit: Payer: Commercial Managed Care - PPO

## 2015-02-26 ENCOUNTER — Telehealth: Payer: Self-pay | Admitting: Podiatry

## 2015-02-26 NOTE — Telephone Encounter (Signed)
Needs results from MRI please

## 2015-03-02 ENCOUNTER — Ambulatory Visit: Payer: Commercial Managed Care - PPO | Admitting: Podiatry

## 2015-03-03 ENCOUNTER — Ambulatory Visit: Payer: Commercial Managed Care - PPO | Admitting: Podiatry

## 2015-03-04 ENCOUNTER — Ambulatory Visit: Payer: Commercial Managed Care - PPO | Admitting: Podiatry

## 2015-03-20 ENCOUNTER — Ambulatory Visit (INDEPENDENT_AMBULATORY_CARE_PROVIDER_SITE_OTHER): Payer: Commercial Managed Care - PPO | Admitting: Podiatry

## 2015-03-20 ENCOUNTER — Encounter: Payer: Self-pay | Admitting: Podiatry

## 2015-03-20 VITALS — BP 133/78 | HR 80 | Resp 16

## 2015-03-20 DIAGNOSIS — M1990 Unspecified osteoarthritis, unspecified site: Secondary | ICD-10-CM | POA: Diagnosis not present

## 2015-03-20 DIAGNOSIS — R609 Edema, unspecified: Secondary | ICD-10-CM | POA: Diagnosis not present

## 2015-03-20 DIAGNOSIS — S96919S Strain of unspecified muscle and tendon at ankle and foot level, unspecified foot, sequela: Secondary | ICD-10-CM | POA: Diagnosis not present

## 2015-03-20 DIAGNOSIS — T148XXA Other injury of unspecified body region, initial encounter: Secondary | ICD-10-CM

## 2015-03-20 DIAGNOSIS — T148 Other injury of unspecified body region: Secondary | ICD-10-CM

## 2015-03-20 MED ORDER — TRAMADOL HCL 50 MG PO TABS: 50.0000 mg | ORAL_TABLET | Freq: Three times a day (TID) | ORAL | Status: DC | PRN

## 2015-03-20 NOTE — Patient Instructions (Signed)
Posterior Tibial Tendon Tendinitis with Rehab Tendonitis is a condition that is characterized by inflammation of a tendon or the lining (sheath) that surrounds it. The inflammation is usually caused by damage to the tendon, such as a tendon tear (strain). Sprains are classified into three categories. Grade 1 sprains cause pain, but the tendon is not lengthened. Grade 2 sprains include a lengthened ligament due to the ligament being stretched or partially ruptured. With grade 2 sprains there is still function, although the function may be diminished. Grade 3 sprains are characterized by a complete tear of the tendon or muscle, and function is usually impaired. Posterior tibialis tendonitis is tendonitis of the posterior tibial tendon, which attaches muscles of the lower leg to the foot. The posterior tibial tendon is located in the back of the ankle and helps the body straighten (plantar flex) and rotate inward (medially rotate) the ankle. SYMPTOMS   Pain, tenderness, swelling, warmth, and/or redness over the back of the inner ankle at the posterior tibial tendon or the inner part of the mid-foot.  Pain that worsens with plantar flexion or medial rotation of the ankle.  A crackling sound (crepitation) when the tendon is moved or touched. CAUSES  Posterior tibial tendonitis occurs when damage to the posterior tibial tendon starts an inflammatory response. Common mechanisms of injury include:  Degenerative (occurs with aging) processes that weaken the tendon and make it more susceptible to injury.  Stress placed on the tendon from an increase in the intensity, frequency, or duration of training.  Direct trauma to the ankle.  Returning to activity before a previous ankle injury is allowed to heal. RISK INCREASES WITH:  Activities that involve repetitive and/or stressful plantar flexion (jumping, kicking, or running up/down hills).  Poor strength and flexibility.  Flat feet.  Previous injury  to the foot, ankle, or leg. PREVENTION   Warm up and stretch properly before activity.  Allow for adequate recovery between workouts.  Maintain physical fitness:  Strength, flexibility, and endurance.  Cardiovascular fitness.  Learn and use proper technique. When possible, have a coach correct improper technique.  Complete rehabilitation from a previous foot, ankle, or leg injury.  If you have flat feet, wear arch supports (orthotics). PROGNOSIS  If treated properly, the symptoms of tendonitis usually resolve within 6 weeks. This period may be shorter for injuries caused by direct trauma. RELATED COMPLICATIONS   Prolonged healing time, if improperly treated or reinjured.  Recurrent symptoms that result in a chronic problem.  Partial or complete tendon tear (rupture) requiring surgery. TREATMENT  Treatment initially involves the use of ice and medication to help reduce pain and inflammation. The use of strengthening and stretching exercises may help reduce pain with activity. These exercises may be performed at home or with referral to a therapist. Often times, your caregiver will recommend immobilizing the ankle to allow the tendon to heal. If you have flat feet, you may be advised to wear orthotic arch supports. If symptoms persist for greater than 6 months despite nonsurgical (conservative) treatment, then surgery may be recommended. MEDICATION   If pain medication is necessary, then nonsteroidal anti-inflammatory medications, such as aspirin and ibuprofen, or other minor pain relievers, such as acetaminophen, are often recommended.  Do not take pain medication for 7 days before surgery.  Prescription pain relievers may be given if deemed necessary by your caregiver. Use only as directed and only as much as you need.  Corticosteroid injections may be given by your caregiver. These injections should  be reserved for the most serious cases because they may only be given a certain  number of times. HEAT AND COLD  Cold treatment (icing) relieves pain and reduces inflammation. Cold treatment should be applied for 10 to 15 minutes every 2 to 3 hours for inflammation and pain and immediately after any activity that aggravates your symptoms. Use ice packs or massage the area with a piece of ice (ice massage).  Heat treatment may be used prior to performing the stretching and strengthening activities prescribed by your caregiver, physical therapist, or athletic trainer. Use a heat pack or soak the injury in warm water. SEEK MEDICAL CARE IF:  Treatment seems to offer no benefit, or the condition worsens.  Any medications produce adverse side effects. EXERCISES RANGE OF MOTION (ROM) AND STRETCHING EXERCISES - Posterior Tibial Tendon Tendinitis These exercises may help you when beginning to rehabilitate your injury. Your symptoms may resolve with or without further involvement from your physician, physical therapist or athletic trainer. While completing these exercises, remember:   Restoring tissue flexibility helps normal motion to return to the joints. This allows healthier, less painful movement and activity.  An effective stretch should be held for at least 30 seconds.  A stretch should never be painful. You should only feel a gentle lengthening or release in the stretched tissue. RANGE OF MOTION - Ankle Plantar Flexion   Sit with your right / left leg crossed over your opposite knee.  Use your opposite hand to pull the top of your foot and toes toward you.  You should feel a gentle stretch on the top of your foot/ankle. Hold this position for __________ seconds. Repeat __________ times. Complete this exercise __________ times per day.  RANGE OF MOTION - Ankle Eversion   Sit with your right / left ankle crossed over your opposite knee.  Grip your foot with your opposite hand, placing your thumb on the top of your foot and your fingers across the bottom of your  foot.  Gently push your foot downward with a slight rotation so your littlest toes rise slightly.  You should feel a gentle stretch on the inside of your ankle. Hold the stretch for __________ seconds. Repeat __________ times. Complete this exercise __________ times per day.  RANGE OF MOTION - Ankle Inversion   Sit with your right / left ankle crossed over your opposite knee.  Grip your foot with your opposite hand, placing your thumb on the bottom of your foot and your fingers across the top of your foot.  Gently pull your foot so the smallest toe comes toward you and your thumb pushes the inside of the ball of your foot away from you.  You should feel a gentle stretch on the outside of your ankle. Hold the stretch for __________ seconds. Repeat __________ times. Complete this exercise __________ times per day.  RANGE OF MOTION - Dorsi/Plantar Flexion  While sitting with your right / left knee straight, draw the top of your foot upward by flexing your ankle. Then reverse the motion, pointing your toes downward.  Hold each position for __________ seconds.  After completing your first set of exercises, repeat this exercise with your knee bent. Repeat __________ times. Complete this exercise __________ times per day.  RANGE OF MOTION - Ankle Alphabet  Imagine your right / left big toe is a pen.  Keeping your hip and knee still, write out the entire alphabet with your "pen." Make the letters as large as you can without   increasing any discomfort. Repeat __________ times. Complete this exercise __________ times per day.  STRETCH - Gastrocsoleus   Sit with your right / left leg extended. Holding onto both ends of a belt or towel, loop it around the ball of your foot.  Keeping your right / left ankle and foot relaxed and your knee straight, pull your foot and ankle toward you using the belt/towel.  You should feel a gentle stretch behind your calf or knee. Hold this position for  __________ seconds. Repeat __________ times. Complete this exercise __________ times per day.  STRETCH - Gastroc, Standing   Place hands on wall.  Extend right / left leg, keeping the front knee somewhat bent.  Slightly point your toes inward on your back foot.  Keeping your right / left heel on the floor and your knee straight, shift your weight toward the wall, not allowing your back to arch.  You should feel a gentle stretch in the right / left calf. Hold this position for __________ seconds. Repeat __________ times. Complete this stretch __________ times per day. STRETCH - Soleus, Standing   Place hands on wall.  Extend right / left leg, keeping the other knee somewhat bent.  Slightly point your toes inward on your back foot.  Keep your right / left heel on the floor, bend your back knee, and slightly shift your weight over the back leg so that you feel a gentle stretch deep in your back calf.  Hold this position for __________ seconds. Repeat __________ times. Complete this stretch __________ times per day. STRENGTHENING EXERCISES - Posterior Tibial Tendon Tendinitis These exercises may help you when beginning to rehabilitate your injury. They may resolve your symptoms with or without further involvement from your physician, physical therapist, or athletic trainer. While completing these exercises, remember:   Muscles can gain both the endurance and the strength needed for everyday activities through controlled exercises.  Complete these exercises as instructed by your physician, physical therapist, or athletic trainer. Progress the resistance and repetitions only as guided. STRENGTH - Dorsiflexors  Secure a rubber exercise band/tubing to a fixed object (i.e., table, pole) and loop the other end around your right / left foot.  Sit on the floor facing the fixed object. The band/tubing should be slightly tense when your foot is relaxed.  Slowly draw your foot back toward you  using your ankle and toes.  Hold this position for __________ seconds. Slowly release the tension in the band and return your foot to the starting position. Repeat __________ times. Complete this exercise __________ times per day.  STRENGTH - Towel Curls  Sit in a chair positioned on a non-carpeted surface.  Place your foot on a towel, keeping your heel on the floor.  Pull the towel toward your heel by only curling your toes. Keep your heel on the floor.  If instructed by your physician, physical therapist, or athletic trainer, add ____________________ at the end of the towel. Repeat __________ times. Complete this exercise __________ times per day. STRENGTH - Ankle Eversion   Secure one end of a rubber exercise band/tubing to a fixed object (table, pole). Loop the other end around your foot just before your toes.  Place your fists between your knees. This will focus your strengthening at your ankle.  Drawing the band/tubing across your opposite foot, slowly pull your little toe out and up. Make sure the band/tubing is positioned to resist the entire motion.  Hold this position for __________ seconds.  Have   your muscles resist the band/tubing as it slowly pulls your foot back to the starting position. Repeat __________ times. Complete this exercise __________ times per day.  STRENGTH - Ankle Inversion   Secure one end of a rubber exercise band/tubing to a fixed object (table, pole). Loop the other end around your foot just before your toes.  Place your fists between your knees. This will focus your strengthening at your ankle.  Slowly, pull your big toe up and in, making sure the band/tubing is positioned to resist the entire motion.  Hold this position for __________ seconds.  Have your muscles resist the band/tubing as it slowly pulls your foot back to the starting position. Repeat __________ times. Complete this exercises __________ times per day.  Document Released:  10/24/2005 Document Revised: 03/10/2014 Document Reviewed: 02/05/2009 [facility] Patient Information 2015 [place], [place]. This information is not intended to replace advice given to you by your health care provider. Make sure you discuss any questions you have with your health care provider.

## 2015-03-23 NOTE — Progress Notes (Signed)
Patient ID: [name], female   DOB: [birth year], 54 y.o.   MRN: [id number]  Subjective: 54 year old female presents to the office today for up evaluation of bilateral ankle and foot pain with a right greater than left. She states that she has constant swelling to both of her ankles and she has pain mostly on the inside aspect of her ankle with prolonged ambulation and weightbearing. She previously had an MRI performed and she was here for the results. Also last appointment she was given prescription for blood work however she did not have this done. Denies any recent injury or trauma. She denies any associated erythema and swelling. She is continuing in regular shoes without a brace orthotic. Denies any systemic complaints such as fevers, chills, nausea, vomiting. No acute changes since last appointment, and no other complaints at this time.   Objective: AAO x3, NAD DP/PT pulses palpable bilaterally, CRT less than 3 seconds Protective sensation intact with Simms Weinstein monofilament, vibratory sensation intact, Achilles tendon reflex intact There is a decrease in medial arch height upon weightbearing bilaterally. Upon gait evaluation there is prolonged pronation without any resupination. There is tenderness palpation along the course of the posterior tibial tendon posterior to the medial malleolus along the insertion into the navicular tuberosity. There is also discomfort along the course of the ATFL bilaterally. No pain along the course of the CFL or PTFL. No tib/fib pain. There is mild overlying edema without any associated erythema or increase in warmth. There is no pain with ankle joint and subtalar joint range of motion. No other areas of tenderness to bilateral lower extremities.MMT 5/5, ROM WNL. No edema, erythema, increase in warmth to bilateral lower extremities.  No open lesions or pre-ulcerative lesions.  No pain with calf compression, swelling, warmth, erythema  Assessment: 54 year old  female with bilateral posterior tibial tendon tear, ATFL tear  Plan: -MRI results were discussed the patient. -All treatment options discussed with the patient including all alternatives, risks, complications.  -at this time she states that she cannot undergo surgical invention until the fall. In the meantime I discussed with her ankle brace to wear as needed which was dispensed to her. I also discussed shoe gear modifications orthotics. -Anti-inflammatories as needed. -Will check arthritis panel. Blood work was given again today.  -Follow-up in 3 months or sooner if any problems are to arise. -Patient encouraged to call the office with any questions, concerns, change in symptoms.

## 2015-03-25 ENCOUNTER — Telehealth: Payer: Self-pay | Admitting: *Deleted

## 2015-03-25 MED ORDER — METHYLPREDNISOLONE 4 MG PO TBPK: ORAL_TABLET | ORAL | Status: DC

## 2015-03-25 NOTE — Telephone Encounter (Addendum)
I left patient a message to call me back.  I need to inform her that Dr. Jacqualyn Posey said labs indicate Gout.  He said we can start her on a Medrol Dose Pack or start her on Colchicine.  Follow up with Dr. Jacqualyn Posey in 2 weeks.

## 2015-03-25 NOTE — Telephone Encounter (Signed)
"  I'm calling you back.  If you could give me a call back on my work phone.  I did speak to someone else but I want to know what the doctor said about it."  "I'm returning your call."  Yes, I was calling to let you know that Dr. Jacqualyn Posey said your bloodwork showed that you have Gout.  He wants to put you on a steroid dose pack or Colchicine.  "Does that mean I don't have to have surgery?"  No, that does not mean you don't have to have surgery.  "Okay, I hate taking steroids."  I sent the prescription to [facility].  "Okay that's great.  What is Gout?"  Gout is basically inflammation.  It can be cause by different factors one being excessive intake of beef, beer or fish.  "I don't drink beer but I do eat fish a lot.  Thanks for your help."

## 2015-04-01 ENCOUNTER — Telehealth: Payer: Self-pay | Admitting: *Deleted

## 2015-04-01 MED ORDER — COLCHICINE 0.6 MG PO TABS: 0.6000 mg | ORAL_TABLET | Freq: Every day | ORAL | Status: DC

## 2015-04-01 NOTE — Telephone Encounter (Signed)
"  I went and got the medication.  I told it was Prednisone."  Yes, it was Medrol Dose Pack that he prescribed for Gout.  "Is that what it was for?  I don't want to take this.  I have allergic reactions to so much.  I probably still have some of this in my system from previous use.  Can he prescribe something else?"  We can send in a prescription for Colchicine.  Do you want to try it? "Yes, I guess."  I called and left patient a message that her prescription was sent to [facility] on [address].  Dr. Jacqualyn Posey wants you to come in to see him in 2 weeks for follow-up.

## 2015-04-01 NOTE — Telephone Encounter (Signed)
"  Call me back about prescription you gave me."    I attempted to call patient.  I left her a message to me back.  I'm returning your call and want to see how you are doing.

## 2015-04-13 ENCOUNTER — Telehealth: Payer: Self-pay | Admitting: *Deleted

## 2015-04-13 NOTE — Telephone Encounter (Signed)
Pt states Dr. Jacqualyn Posey had suggested surgery and she would like to have someone call her and explain the surgery to her.  I informed pt the specifics of her particular surgery would need to be explained to her by Dr. Jacqualyn Posey, but generally speaking it appeared she would need a tendon repair.  The general care for that type of surgery post-op, was 4-6 weeks out-of-work, in a cast with a knee scooter or may be released for seated duty after 7-10 days depending on her progress.  Pt states she has an appt on the 24th and will get the details.

## 2015-04-27 ENCOUNTER — Telehealth: Payer: Self-pay | Admitting: *Deleted

## 2015-04-27 NOTE — Telephone Encounter (Addendum)
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  Pt states she needs a note stating she doesn't have to walk too much.  Contacted pt to pick up Medical Note, pt states she also needs notes and MRI results confirming the necessity for her rest periods.  I copied office visit notes and MRI results for pt to carry to the community service division.  See Medical Absence Report date 04/28/2015.

## 2015-04-27 NOTE — Telephone Encounter (Signed)
OK - thanks

## 2015-04-28 ENCOUNTER — Encounter: Payer: Self-pay | Admitting: *Deleted

## 2015-05-01 ENCOUNTER — Encounter: Payer: Self-pay | Admitting: Podiatry

## 2015-05-01 ENCOUNTER — Telehealth: Payer: Self-pay | Admitting: *Deleted

## 2015-05-01 ENCOUNTER — Ambulatory Visit (INDEPENDENT_AMBULATORY_CARE_PROVIDER_SITE_OTHER): Payer: Commercial Managed Care - PPO | Admitting: Podiatry

## 2015-05-01 VITALS — BP 136/87 | HR 92 | Resp 12

## 2015-05-01 DIAGNOSIS — T148 Other injury of unspecified body region: Secondary | ICD-10-CM

## 2015-05-01 DIAGNOSIS — M25373 Other instability, unspecified ankle: Secondary | ICD-10-CM

## 2015-05-01 DIAGNOSIS — T148XXA Other injury of unspecified body region, initial encounter: Secondary | ICD-10-CM

## 2015-05-01 DIAGNOSIS — S96919S Strain of unspecified muscle and tendon at ankle and foot level, unspecified foot, sequela: Secondary | ICD-10-CM | POA: Diagnosis not present

## 2015-05-01 NOTE — Telephone Encounter (Signed)
"  I was in the office this morning and spoke to Dr. Jacqualyn Posey about surgery.  For insurance purposes I need to have something stating what type of procedure I'm going to have."

## 2015-05-01 NOTE — Telephone Encounter (Signed)
Pt states she trying to get the information to her insurance company prior to the surgery, needs procedures.  I informed pt, our office and the surgical center would apply for the insurance and give them all of the information she is requesting.  Pt states she wants to get things started.

## 2015-05-02 NOTE — Progress Notes (Signed)
Patient ID: [name], female   DOB: 1961/05/29, 54 y.o.   MRN: [id number]  Subjective: [name]  presents to the office today for up evaluation of bilateral ankle and foot pain with a right greater than left. She states that she has constant pain to both of her ankles and she has pain mostly on the inside aspect of her ankle with prolonged ambulation and weightbearing. She describes as a throbbing, achy pain. She does continue with ankle brace most time which seems to help. She states that after she stands for approximate one hour she has pain to the area. She also had her blood work obtained since last appointment which revealed an increase or casted. Prescribed medication for this however she did not want take medication. Denies any systemic complaints such as fevers, chills, nausea, vomiting. No acute changes since last appointment, and no other complaints at this time.   Objective: AAO x3, NAD DP/PT pulses palpable bilaterally, CRT less than 3 seconds Protective sensation intact with Simms Weinstein monofilament, vibratory sensation intact, Achilles tendon reflex intact There is a decrease in medial arch height upon weightbearing bilaterally. Upon gait evaluation there is prolonged pronation without any resupination. There is continued tenderness palpation along the course of the posterior tibial tendon posterior to the medial malleolus along the insertion into the navicular tuberosity. There is also discomfort along the course of the ATFL bilaterally. No pain along the course of the CFL or PTFL. No tib/fib pain. There is mild overlying edema without any associated erythema or increase in warmth. There is no pain with ankle joint and subtalar joint range of motion. No other areas of tenderness to bilateral lower extremities.MMT 5/5, ROM WNL. No edema, erythema, increase in warmth to bilateral lower extremities.  No open lesions or pre-ulcerative lesions.  No pain with calf compression, swelling,  warmth, erythema  Assessment: 54 year old female with bilateral posterior tibial tendon tear, ATFL tear with continued pain.   Plan: -All treatment options discussed with the patient including all alternatives, risks, complications.  -She states that she would like to have surgery performed in the fall. She'll will follow-up in August to further discuss surgical intervention. In the meantime I discussed with her continue with shoe gear modifications and ankle brace as tolerated. We again showed her today how to apply the ankle brace that she states that she forgot how to do this. She is unable to take anti-inflammatories I discussed with him a Medrol Dosepak however she does not want to take this at this time. A letter was given to her she has to perform continue services she is unable to stand for greater than 1 hour without any discomfort. -Follow-up in August to discuss surgery or sooner if any problems are to arise. In the meantime I encouraged her to call the office with any questions, concerns, change in symptoms.  Celesta Gentile, DPM

## 2015-05-04 ENCOUNTER — Encounter: Payer: Self-pay | Admitting: *Deleted

## 2015-05-05 NOTE — Telephone Encounter (Signed)
I'm calling to let you know I sent you the codes for surgery in my-chart.  "Okay thank you, I haven't even looked yet."

## 2015-06-26 ENCOUNTER — Ambulatory Visit: Payer: Commercial Managed Care - PPO | Admitting: Podiatry

## 2015-10-12 IMAGING — MG MM DIGITAL SCREENING BILAT
4 series · 4 of 4 positions shown · non-contrast
Comparison: Previous exam(s).

CLINICAL DATA: Screening.

EXAM:
DIGITAL SCREENING BILATERAL MAMMOGRAM WITH CAD

[R MLO]
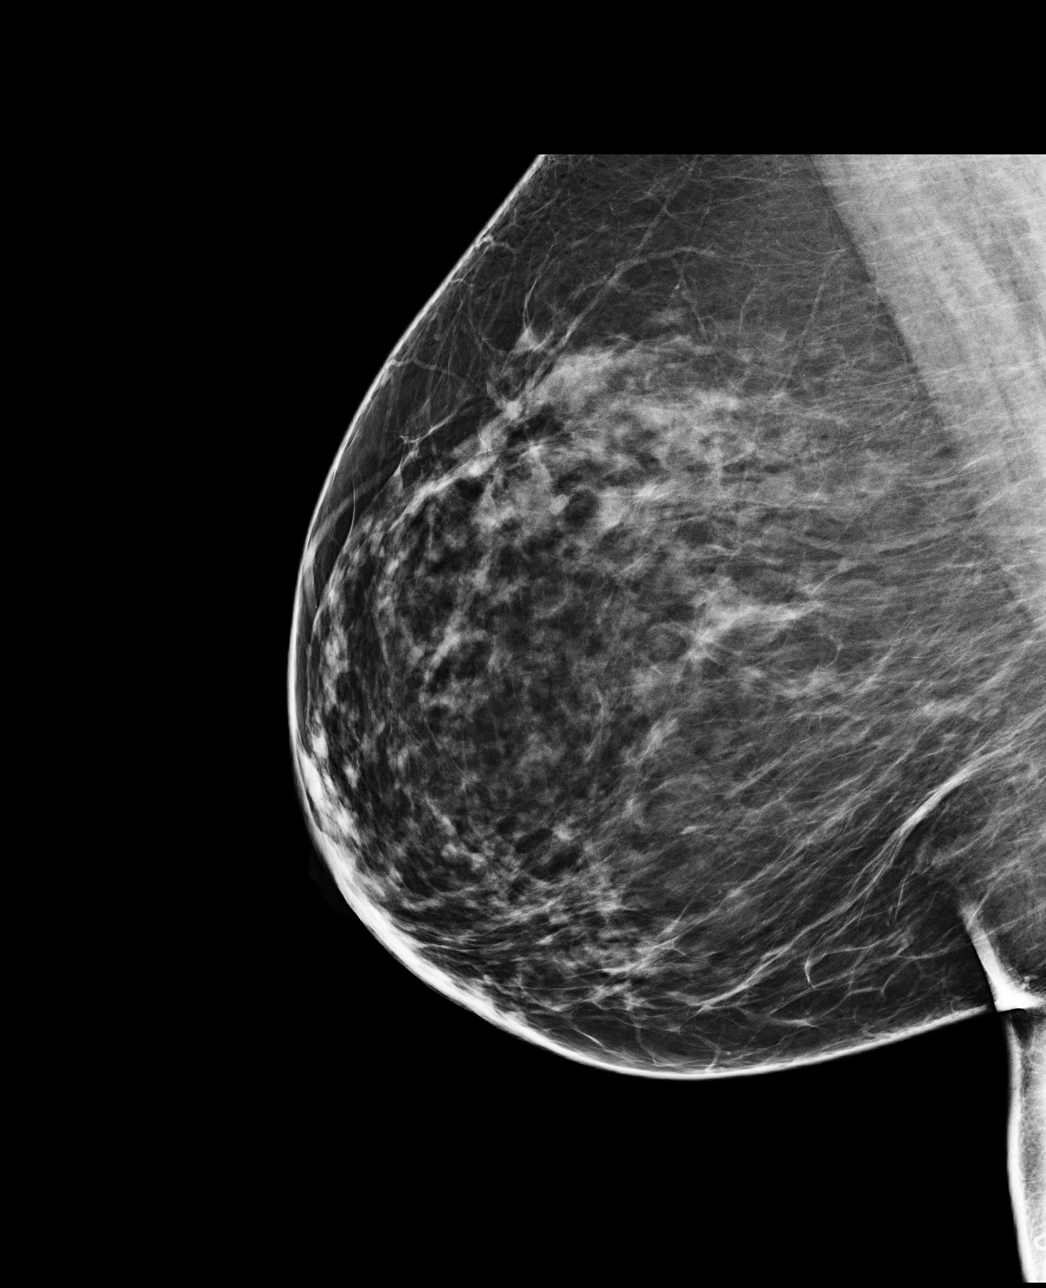

[R CC]
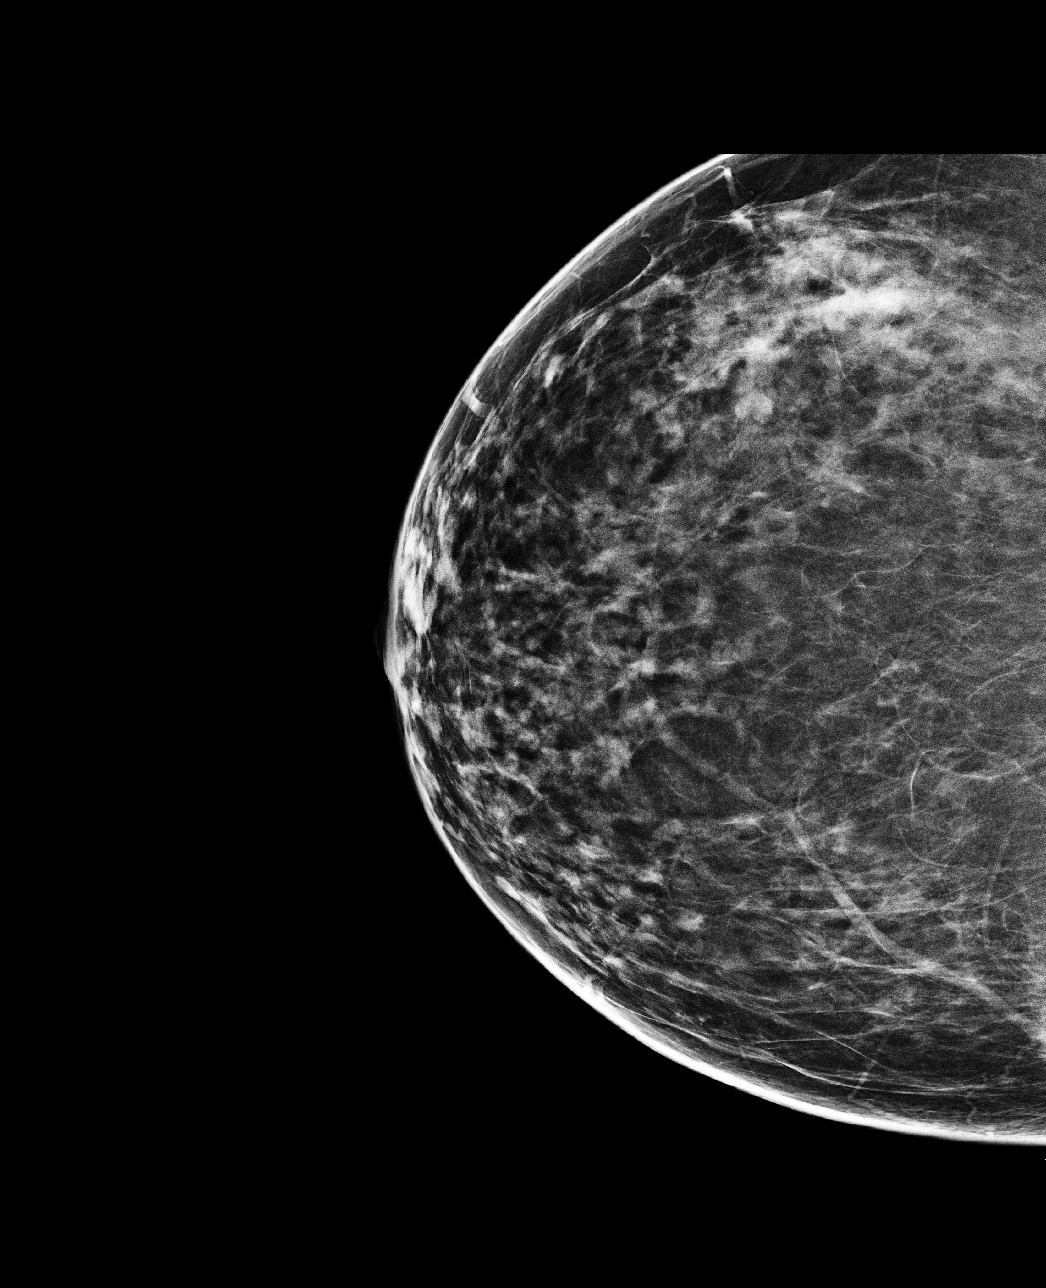

[L MLO]
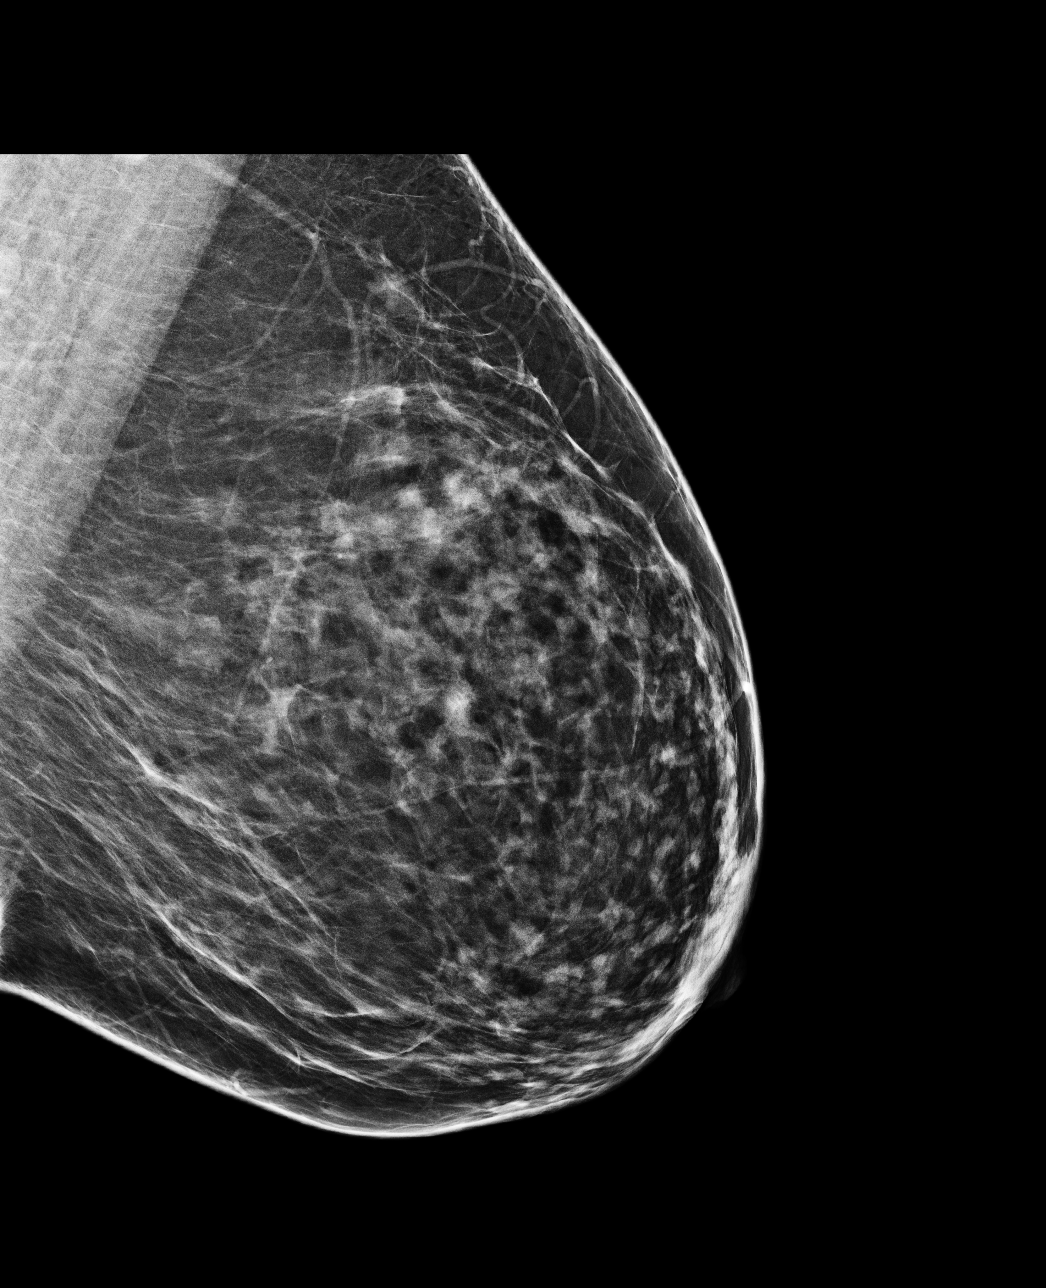

[L CC]
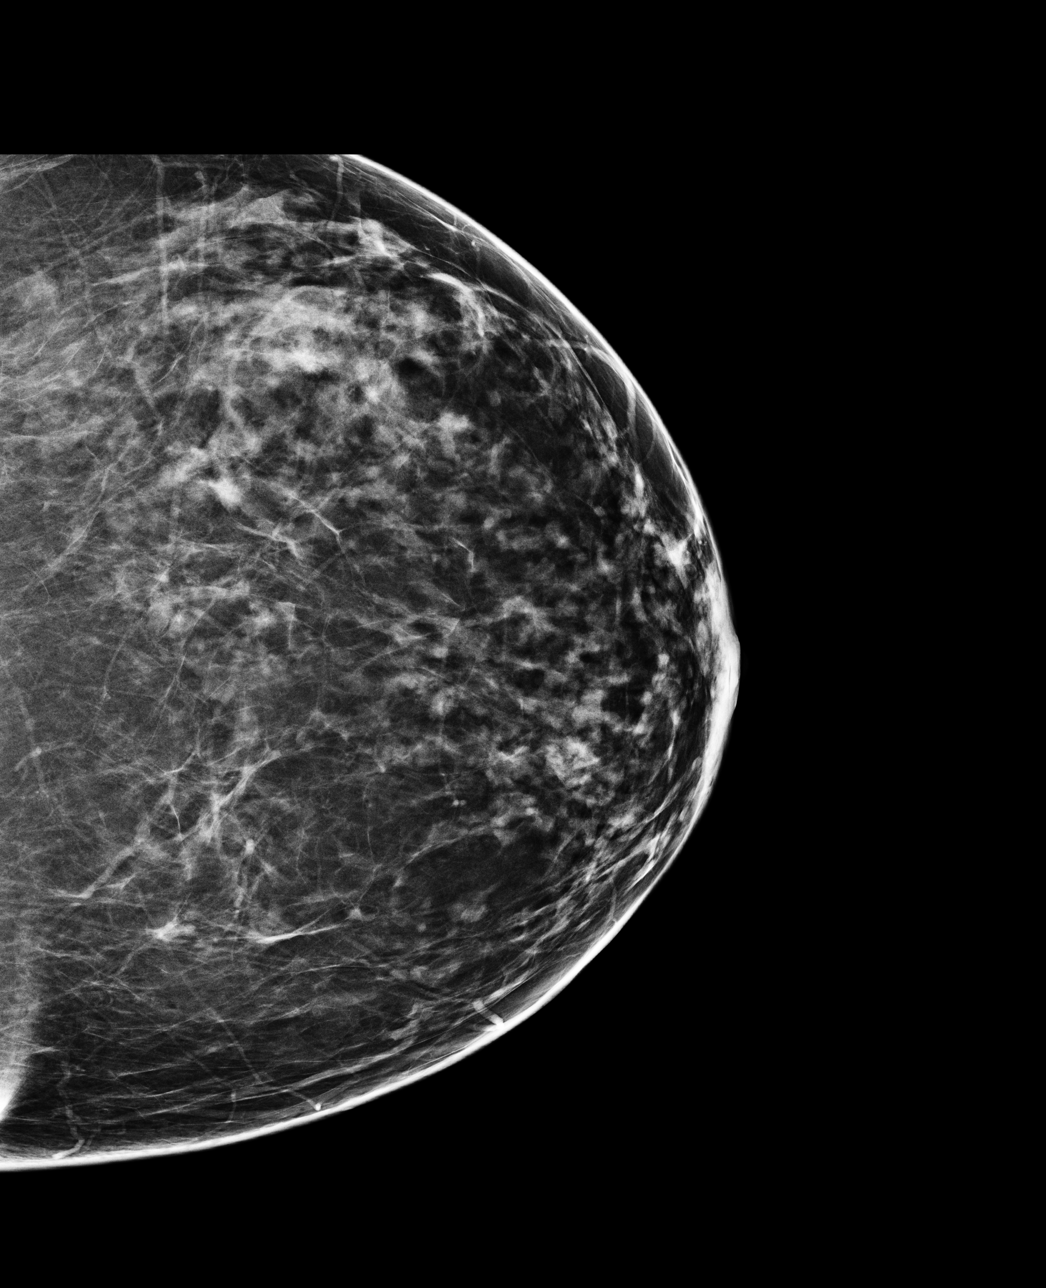

[4 of 4 positions shown; findings below may reference images not displayed]

ACR Breast Density Category c: The breasts are heterogeneously
dense, which may obscure small masses.
FINDINGS: There are no findings suspicious for malignancy. Images were
processed with CAD.
IMPRESSION: No mammographic evidence of malignancy. A result letter of this
screening mammogram will be mailed directly to the patient.

RECOMMENDATION:
Screening mammogram in one year. (Code:[id number])

BI-RADS CATEGORY  1: Negative

## 2015-12-31 ENCOUNTER — Other Ambulatory Visit: Payer: Self-pay

## 2015-12-31 DIAGNOSIS — Z1231 Encounter for screening mammogram for malignant neoplasm of breast: Secondary | ICD-10-CM

## 2016-01-08 ENCOUNTER — Ambulatory Visit: Payer: Commercial Managed Care - PPO

## 2016-01-22 ENCOUNTER — Ambulatory Visit
Admission: RE | Admit: 2016-01-22 | Discharge: 2016-01-22 | Disposition: A | Discharge status: Home / Self Care | Payer: Managed Care, Other (non HMO) | Source: Ambulatory Visit

## 2016-01-22 DIAGNOSIS — Z1231 Encounter for screening mammogram for malignant neoplasm of breast: Secondary | ICD-10-CM

## 2016-01-29 ENCOUNTER — Other Ambulatory Visit: Payer: Self-pay | Admitting: Internal Medicine

## 2016-01-29 DIAGNOSIS — E041 Nontoxic single thyroid nodule: Secondary | ICD-10-CM

## 2016-02-05 ENCOUNTER — Other Ambulatory Visit: Payer: Managed Care, Other (non HMO)

## 2016-02-12 ENCOUNTER — Ambulatory Visit (HOSPITAL_COMMUNITY): Payer: Managed Care, Other (non HMO)

## 2016-02-25 ENCOUNTER — Ambulatory Visit (HOSPITAL_COMMUNITY): Payer: Managed Care, Other (non HMO)

## 2016-03-19 ENCOUNTER — Ambulatory Visit (INDEPENDENT_AMBULATORY_CARE_PROVIDER_SITE_OTHER): Payer: Managed Care, Other (non HMO) | Admitting: Emergency Medicine

## 2016-03-19 ENCOUNTER — Encounter (HOSPITAL_COMMUNITY): Payer: Self-pay | Admitting: Emergency Medicine

## 2016-03-19 ENCOUNTER — Emergency Department (HOSPITAL_COMMUNITY)
Admission: EM | Admit: 2016-03-19 | Discharge: 2016-03-19 | Disposition: A | Discharge status: Home / Self Care | Payer: Managed Care, Other (non HMO) | Attending: Emergency Medicine | Admitting: Emergency Medicine

## 2016-03-19 VITALS — BP 124/80 | HR 98 | Temp 97.8°F | Resp 19

## 2016-03-19 DIAGNOSIS — K219 Gastro-esophageal reflux disease without esophagitis: Secondary | ICD-10-CM | POA: Insufficient documentation

## 2016-03-19 DIAGNOSIS — Z91010 Allergy to peanuts: Secondary | ICD-10-CM | POA: Insufficient documentation

## 2016-03-19 DIAGNOSIS — I10 Essential (primary) hypertension: Secondary | ICD-10-CM | POA: Diagnosis not present

## 2016-03-19 DIAGNOSIS — Z7951 Long term (current) use of inhaled steroids: Secondary | ICD-10-CM | POA: Diagnosis not present

## 2016-03-19 DIAGNOSIS — R0602 Shortness of breath: Secondary | ICD-10-CM

## 2016-03-19 DIAGNOSIS — E785 Hyperlipidemia, unspecified: Secondary | ICD-10-CM | POA: Diagnosis not present

## 2016-03-19 DIAGNOSIS — T782XXA Anaphylactic shock, unspecified, initial encounter: Secondary | ICD-10-CM

## 2016-03-19 DIAGNOSIS — T781XXA Other adverse food reactions, not elsewhere classified, initial encounter: Secondary | ICD-10-CM | POA: Diagnosis present

## 2016-03-19 DIAGNOSIS — Z79891 Long term (current) use of opiate analgesic: Secondary | ICD-10-CM | POA: Insufficient documentation

## 2016-03-19 DIAGNOSIS — L500 Allergic urticaria: Secondary | ICD-10-CM

## 2016-03-19 DIAGNOSIS — Z79899 Other long term (current) drug therapy: Secondary | ICD-10-CM | POA: Diagnosis not present

## 2016-03-19 DIAGNOSIS — T7840XA Allergy, unspecified, initial encounter: Secondary | ICD-10-CM | POA: Diagnosis not present

## 2016-03-19 DIAGNOSIS — Z7952 Long term (current) use of systemic steroids: Secondary | ICD-10-CM | POA: Insufficient documentation

## 2016-03-19 DIAGNOSIS — T7809XA Anaphylactic reaction due to other food products, initial encounter: Secondary | ICD-10-CM | POA: Insufficient documentation

## 2016-03-19 MED ORDER — SODIUM CHLORIDE 0.9 % IV BOLUS (SEPSIS)
1000.0000 mL | Freq: Once | INTRAVENOUS | Status: AC
  Administered 2016-03-19: 1000 mL via INTRAVENOUS

## 2016-03-19 MED ORDER — EPINEPHRINE 0.3 MG/0.3ML IJ SOAJ: 0.3000 mg | Freq: Once | INTRAMUSCULAR | Status: DC

## 2016-03-19 MED ORDER — FAMOTIDINE IN NACL 20-0.9 MG/50ML-% IV SOLN
20.0000 mg | Freq: Once | INTRAVENOUS | Status: AC
  Administered 2016-03-19: 20 mg via INTRAVENOUS
  Filled 2016-03-19: qty 50

## 2016-03-19 MED ORDER — DIPHENHYDRAMINE HCL 50 MG/ML IJ SOLN
50.0000 mg | Freq: Once | INTRAMUSCULAR | Status: AC
  Administered 2016-03-19: 50 mg via INTRAMUSCULAR

## 2016-03-19 MED ORDER — EPINEPHRINE 0.3 MG/0.3ML IJ SOAJ: 0.3000 mg | Freq: Once | INTRAMUSCULAR | Status: AC | PRN

## 2016-03-19 MED ORDER — METHYLPREDNISOLONE SODIUM SUCC 125 MG IJ SOLR
125.0000 mg | Freq: Once | INTRAMUSCULAR | Status: AC
  Administered 2016-03-19: 125 mg via INTRAVENOUS

## 2016-03-19 MED ORDER — PREDNISONE 50 MG PO TABS
ORAL_TABLET | ORAL | Status: DC
Start: 2016-03-19 — End: 2018-05-29

## 2016-03-19 NOTE — Discharge Instructions (Signed)
If you develop any wheezing or shortness of breath administered her EpiPen immediately as instructed. Call 911 immediately he will need to present for evaluation at any time after you give yourself your EpiPen.  If you develop any wheezing or shortness of breath administered her EpiPen immediately as instructed. Call 911 immediately he will need to present for evaluation at any time after you give yourself your EpiPen.  Please follow with your primary care doctor in the next 2 days for a check-up. They must obtain records for further management.   Do not hesitate to return to the Emergency Department for any new, worsening or concerning symptoms.    Anaphylactic Reaction An anaphylactic reaction is a sudden, severe allergic reaction that involves the whole body. It can be life threatening. A hospital stay is often required. People with asthma, eczema, or hay fever are slightly more likely to have an anaphylactic reaction. CAUSES  An anaphylactic reaction may be caused by anything to which you are allergic. After being exposed to the allergic substance, your immune system becomes sensitized to it. When you are exposed to that allergic substance again, an allergic reaction can occur. Common causes of an anaphylactic reaction include:  Medicines.  Foods, especially peanuts, wheat, shellfish, milk, and eggs.  Insect bites or stings.  Blood products.  Chemicals, such as dyes, latex, and contrast material used for imaging tests. SYMPTOMS  When an allergic reaction occurs, the body releases histamine and other substances. These substances cause symptoms such as tightening of the airway. Symptoms often develop within seconds or minutes of exposure. Symptoms may include:  Skin rash or hives.  Itching.  Chest tightness.  Swelling of the eyes, tongue, or lips.  Trouble breathing or swallowing.  Lightheadedness or fainting.  Anxiety or confusion.  Stomach pains, vomiting, or  diarrhea.  Nasal congestion.  A fast or irregular heartbeat (palpitations). DIAGNOSIS  Diagnosis is based on your history of recent exposure to allergic substances, your symptoms, and a physical exam. Your caregiver may also perform blood or urine tests to confirm the diagnosis. TREATMENT  Epinephrine medicine is the main treatment for an anaphylactic reaction. Other medicines that may be used for treatment include antihistamines, steroids, and albuterol. In severe cases, fluids and medicine to support blood pressure may be given through an intravenous line (IV). Even if you improve after treatment, you need to be observed to make sure your condition does not get worse. This may require a stay in the hospital. [place] a medical alert bracelet or necklace stating your allergy.  You and your family must learn how to use an anaphylaxis kit or give an epinephrine injection to temporarily treat an emergency allergic reaction. Always carry your epinephrine injection or anaphylaxis kit with you. This can be lifesaving if you have a severe reaction.  Do not drive or perform tasks after treatment until the medicines used to treat your reaction have worn off, or until your caregiver says it is okay.  If you have hives or a rash:  Take medicines as directed by your caregiver.  You may use an over-the-counter antihistamine (diphenhydramine) as needed.  Apply cold compresses to the skin or take baths in cool water. Avoid hot baths or showers. SEEK MEDICAL CARE IF:   You develop symptoms of an allergic reaction to a new substance. Symptoms may start right away or minutes later.  You develop a rash, hives, or itching.  You develop new symptoms. [place]  IF:   You have swelling of the mouth, difficulty breathing, or wheezing.  You have a tight feeling in your chest or throat.  You develop hives, swelling, or itching all over your body.  You develop  severe vomiting or diarrhea.  You feel faint or pass out. This is an emergency. Use your epinephrine injection or anaphylaxis kit as you have been instructed. Call your local emergency services (911 in [place] Even if you improve after the injection, you need to be examined at a hospital emergency department. MAKE SURE YOU:   Understand these instructions.  Will watch your condition.  Will get help right away if you are not doing well or get worse.   This information is not intended to replace advice given to you by your health care provider. Make sure you discuss any questions you have with your health care provider.   Document Released: 10/24/2005 Document Revised: 10/29/2013 Document Reviewed: 05/06/2015 Elsevier Interactive Patient Education [employer].

## 2016-03-19 NOTE — ED Notes (Signed)
Bed: [id number] Expected date: 03/19/16 Expected time: 3:55 PM Means of arrival:  Comments: Allergic reaction, treated at [facility]

## 2016-03-19 NOTE — ED Provider Notes (Signed)
CSN: [id number]     Arrival date & time 03/19/16  1556 History   First MD Initiated Contact with Patient 03/19/16 1559     Chief Complaint  Patient presents with  . Allergic Reaction     (Consider location/radiation/quality/duration/timing/severity/associated sxs/prior Treatment) HPI  Blood pressure 138/78, pulse 100, temperature 98.2 F (36.8 C), temperature source Oral, resp. rate 20, SpO2 100 %.  [name] is a 55 y.o. female part in by EMS complaining of allergic reaction. Patient presented to urgent care after she developed diffuse hives and shortness of breath with lip swelling. She was given 50 of Benadryl IM and 125 of Solu-Medrol IV at urgent care, EMS arrived and gave her 50 of Benadryl IV and administered 0.3 epi IM. Patient states that she feels significantly improved, states that the shortness of breath and lip and tongue swelling have resolved, she has mild remaining pruritus with no hives. 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 She did not self administer the expired EpiPen. States that she has a nut allergy, she ate Moon high ice cream earlier in the day and thinks that this may have been the inciting allergen. Patient denies any nausea or vomiting. Never been administered epinephrine in the past.   Past Medical History  Diagnosis Date  . LIPOMA     R shoulder s/p excision 12/2010  . HYPERLIPIDEMIA     TGs, never on med rx  . ANEMIA-NOS   . HYPERTENSION   . ALLERGIC RHINITIS   . GERD   . Headache(784.0)     chronic  . Thyroid nodule     stable [place] 09/2009 and 11/2011   Past Surgical History  Procedure Laterality Date  . Abdominal hysterectomy  2007  . (r) shoulder lipoma excision  12/2010    [facility]  . Colonoscopy N/A 12/31/2013    Procedure: COLONOSCOPY;  Surgeon: Garlan Fair, MD;  Location: [facility];  Service: Endoscopy;  Laterality: N/A;   Family History  Problem Relation Age of Onset  . Arthritis  Other     grandparents  . Breast cancer Other     other relative  . Diabetes Other     grandparents  . Hyperlipidemia Other     grandparents  . Hypertension Other     grandparent & parents  . Thyroid disease Brother   . Transient ischemic attack Sister    Social History  Substance Use Topics  . Smoking status: Never Smoker   . Smokeless tobacco: Never Used     Comment: Divorced, lives alone -  . Alcohol Use: 0.0 oz/week    0 Standard drinks or equivalent per week     Comment: occ   OB History    Gravida Para Term Preterm AB TAB SAB Ectopic Multiple Living   2 2 2       2      Review of Systems  10 systems reviewed and found to be negative, except as noted in the HPI.   Allergies  Imodium; Peanut-containing drug products; Penicillins; Codeine; Doxycycline; and Percocet  Home Medications   Prior to Admission medications   Medication Sig Start Date End Date Taking? Authorizing Provider  acetaminophen (TYLENOL) 500 MG tablet Take 1,000 mg by mouth every 6 (six) hours as needed for mild pain or moderate pain.    Historical Provider, MD  cetirizine (ZYRTEC) 10 MG tablet Take 10 mg by mouth daily.    Historical Provider, MD  colchicine 0.6 MG tablet Take 1 tablet (0.6 mg total) by mouth daily. 04/01/15   Trula Slade, DPM  diphenhydrAMINE (BENADRYL) 25 mg capsule Take 25 mg by mouth every 6 (six) hours as needed for allergies.    Historical Provider, MD  EPINEPHrine (EPIPEN 2-PAK) 0.3 mg/0.3 mL IJ SOAJ injection Inject 0.3 mLs (0.3 mg total) into the muscle once. 03/19/16   Darlyne Russian, MD  EPINEPHrine (EPIPEN IJ) Inject 1 Units as directed as needed.    Historical Provider, MD  fluticasone (FLONASE) 50 MCG/ACT nasal spray Place 1 spray into both nostrils daily.    Historical Provider, MD  levocetirizine (XYZAL) 5 MG tablet Take 5 mg by mouth at bedtime.    Historical Provider, MD  loratadine (CLARITIN) 10 MG tablet Take 10 mg by mouth daily.    Historical Provider, MD   methylPREDNISolone (MEDROL DOSEPAK) 4 MG TBPK tablet Take as directed 03/25/15   Trula Slade, DPM  Multiple Vitamin (MULTIVITAMIN) tablet Take 1 tablet by mouth daily.    Historical Provider, MD  OLOPATADINE HCL NA Place 1 Squirt into the nose daily.    Historical Provider, MD  omeprazole (PRILOSEC) 20 MG capsule Take 1 capsule (20 mg total) by mouth daily. 10/11/12   Rowe Clack, MD  ranitidine (ZANTAC) 150 MG tablet Take 150 mg by mouth 2 (two) times daily as needed for heartburn.     Historical Provider, MD  SUMAtriptan (IMITREX) 100 MG tablet Take 100 mg by mouth every 2 (two) hours as needed for migraine or headache. May repeat in 2 hours if headache persists or recurs.    Historical Provider, MD  traMADol (ULTRAM) 50 MG tablet Take 1 tablet (50 mg total) by mouth every 8 (eight) hours as needed. 03/20/15   Trula Slade, DPM   BP 138/78 mmHg  Pulse 100  Temp(Src) 98.2 F (36.8 C) (Oral)  Resp 20  SpO2 100% Physical Exam  Constitutional: She is oriented to person, place, and time. She appears well-developed and well-nourished. No distress.  HENT:  Head: Normocephalic and atraumatic.  Mouth/Throat: Oropharynx is clear and moist.  Eyes: Conjunctivae and EOM are normal. Pupils are equal, round, and reactive to light.  Neck: Normal range of motion.  Cardiovascular: Normal rate, regular rhythm and intact distal pulses.   Pulmonary/Chest: Effort normal and breath sounds normal. No respiratory distress. She has no wheezes. She has no rales. She exhibits no tenderness.  No stridor or drooling. No posterior pharynx edema, lip or tongue swelling. Pt reclining comfortably, speaking in complete sentences.   No Wheezing, excellent air movement in all fields.     Abdominal: Soft. Bowel sounds are normal. She exhibits no distension and no mass. There is no tenderness. There is no rebound and no guarding.  Musculoskeletal: Normal range of motion. She exhibits no edema or tenderness.   Neurological: She is alert and oriented to person, place, and time.  Skin: No rash noted. She is not diaphoretic.  Psychiatric: She has a normal mood and affect.  Nursing note and vitals reviewed.   ED Course  Procedures (including critical care time) Labs Review Labs Reviewed - No data to display  Imaging Review No results found. I have personally reviewed and evaluated these images and lab results as part of my medical decision-making.   EKG Interpretation None      MDM   Final diagnoses:  Anaphylactic reaction, initial encounter    Filed Vitals:   03/19/16 1610 03/19/16 1618  BP:  138/78  Pulse:  100  Temp:  98.2 F (36.8 C)  TempSrc:  Oral  Resp:  20  SpO2: 100% 100%    Medications  sodium chloride 0.9 % bolus 1,000 mL (1,000 mLs Intravenous New Bag/Given 03/19/16 1641)  famotidine (PEPCID) IVPB 20 mg premix (20 mg Intravenous New Bag/Given [id number])    [name] is 55 y.o. female presenting with Anaphylactic reaction with diffuse hives shortness of breath and lip and tongue swelling. On my exam patient has no angioedema, lung sounds clear to auscultation, she is resting comfortably and speaking in complete sentences, her hives have resolved. I will add on Pepcid IV and give her some fluids. Patient will be observed in the ED for rebound reaction.  6:46 PM: Patient seen and reevaluated the bedside, she is resting comfortably, no complaints other than dry mouth which is likely secondary to the Benadryl administration. Lung sounds clear to auscultation, no tachycardia, tachypnea, no rash on how to administer the EpiPen, she understands that she will call 911 if she self administers the EpiPen. Patient given referral for allergy testing and prednisone burst.  Discussed case with attending physician who agrees with care plan and disposition.   Evaluation does not show pathology that would require ongoing emergent intervention or inpatient treatment. Pt is  hemodynamically stable and mentating appropriately. Discussed findings and plan with patient/guardian, who agrees with care plan. All questions answered. Return precautions discussed and outpatient follow up given.   New Prescriptions   EPINEPHRINE (EPIPEN 2-PAK) 0.3 MG/0.3 ML IJ SOAJ INJECTION    Inject 0.3 mLs (0.3 mg total) into the muscle once as needed (for severe allergic reaction). CAll 911 immediately if you have to use this medicine   PREDNISONE (DELTASONE) 50 MG TABLET    Take 1 tablet daily with breakfast         Monico Blitz, PA-C 03/19/16 1846  Quintella Reichert, MD 03/20/16 [phone]

## 2016-03-19 NOTE — ED Notes (Signed)
Pt from [facility] via EMS with an allergic reaction. Pt is allergic to peanuts- she thinks maybe the plant that makes an ice cream bar she ate had peanuts. Pt was given 50 IM of benadryl and 125 IV solumedrol at UC. Pt was given an epipen shot by EMS and 50 mg IV benadryl. Pt has clear lung sounds at this time

## 2016-03-19 NOTE — Progress Notes (Signed)
By signing my name below, I, Judithe Modest, attest that this documentation has been prepared under the direction and in the presence of Nena Jordan, MD. Electronically Signed: Judithe Modest, ER Scribe. 03/19/2016. 3:36 PM.  Chief Complaint:  Chief Complaint  Patient presents with  . Allergic Reaction    Peanut allergy   HPI: [name] is a 55 y.o. female who reports to [facility] today complaining of an SOB, hives, and throat closing due to an allergic reaction to moon pie ice cream that she ate four hours ago. She is allergic to penicillin, hydrocodone, and a number of other medications. She has had allergic responses to similar to this several times in the past. She has an epipen but it recently expired.    Past Medical History  Diagnosis Date  . LIPOMA     R shoulder s/p excision 12/2010  . HYPERLIPIDEMIA     TGs, never on med rx  . ANEMIA-NOS   . HYPERTENSION   . ALLERGIC RHINITIS   . GERD   . Headache(784.0)     chronic  . Thyroid nodule     stable [place] 09/2009 and 11/2011   Past Surgical History  Procedure Laterality Date  . Abdominal hysterectomy  2007  . (r) shoulder lipoma excision  12/2010    [facility]  . Colonoscopy N/A 12/31/2013    Procedure: COLONOSCOPY;  Surgeon: Garlan Fair, MD;  Location: [facility];  Service: Endoscopy;  Laterality: N/A;   Social History   Social History  . Marital Status: Legally Separated    Spouse Name: N/A  . Number of Children: 2  . Years of Education: college   Occupational History  .  [employer]   Social History Main Topics  . Smoking status: Never Smoker   . Smokeless tobacco: Never Used     Comment: Divorced, lives alone -  . Alcohol Use: 0.0 oz/week    0 Standard drinks or equivalent per week     Comment: occ  . Drug Use: No  . Sexual Activity: Yes    Birth Control/ Protection: None, Surgical   Other Topics Concern  . Not on file   Social History Narrative   Patient is single, has 2  children   Patient is right handed    Education level is some college   Caffeine consumption 1 cup daily   Family History  Problem Relation Age of Onset  . Arthritis Other     grandparents  . Breast cancer Other     other relative  . Diabetes Other     grandparents  . Hyperlipidemia Other     grandparents  . Hypertension Other     grandparent & parents  . Thyroid disease Brother   . Transient ischemic attack Sister    Allergies  Allergen Reactions  . Imodium [Loperamide] Anaphylaxis  . Peanut-Containing Drug Products Other (See Comments)  . Penicillins Hives  . Codeine Rash  . Doxycycline Rash  . Percocet [Oxycodone-Acetaminophen] Rash   Prior to Admission medications   Medication Sig Start Date End Date Taking? Authorizing Provider  acetaminophen (TYLENOL) 500 MG tablet Take 1,000 mg by mouth every 6 (six) hours as needed for mild pain or moderate pain.    Historical Provider, MD  cetirizine (ZYRTEC) 10 MG tablet Take 10 mg by mouth daily.    Historical Provider, MD  colchicine 0.6 MG tablet Take 1 tablet (0.6 mg total) by mouth daily. 04/01/15   Rodman Key  R Wagoner, DPM  diphenhydrAMINE (BENADRYL) 25 mg capsule Take 25 mg by mouth every 6 (six) hours as needed for allergies.    Historical Provider, MD  EPINEPHrine (EPIPEN IJ) Inject 1 Units as directed as needed.    Historical Provider, MD  fluticasone (FLONASE) 50 MCG/ACT nasal spray Place 1 spray into both nostrils daily.    Historical Provider, MD  levocetirizine (XYZAL) 5 MG tablet Take 5 mg by mouth at bedtime.    Historical Provider, MD  loratadine (CLARITIN) 10 MG tablet Take 10 mg by mouth daily.    Historical Provider, MD  methylPREDNISolone (MEDROL DOSEPAK) 4 MG TBPK tablet Take as directed 03/25/15   Trula Slade, DPM  Multiple Vitamin (MULTIVITAMIN) tablet Take 1 tablet by mouth daily.    Historical Provider, MD  OLOPATADINE HCL NA Place 1 Squirt into the nose daily.    Historical Provider, MD  omeprazole  (PRILOSEC) 20 MG capsule Take 1 capsule (20 mg total) by mouth daily. 10/11/12   Rowe Clack, MD  ranitidine (ZANTAC) 150 MG tablet Take 150 mg by mouth 2 (two) times daily as needed for heartburn.     Historical Provider, MD  SUMAtriptan (IMITREX) 100 MG tablet Take 100 mg by mouth every 2 (two) hours as needed for migraine or headache. May repeat in 2 hours if headache persists or recurs.    Historical Provider, MD  traMADol (ULTRAM) 50 MG tablet Take 1 tablet (50 mg total) by mouth every 8 (eight) hours as needed. 03/20/15   Trula Slade, DPM     ROS: The patient denies fevers, chills, night sweats, unintentional weight loss, chest pain, palpitations, wheezing, dyspnea on exertion, nausea, vomiting, abdominal pain, dysuria, hematuria, melena, numbness, weakness, or tingling.   All other systems have been reviewed and were otherwise negative with the exception of those mentioned in the HPI and as above.    PHYSICAL EXAM: Filed Vitals:   03/19/16 1525  BP: 124/80  Pulse: 98  Temp: 97.8 F (36.6 C)  Resp: 19   There is no weight on file to calculate BMI.   General: Alert, In acute distress HEENT:  Normocephalic, atraumatic, oropharynx patent. Eye: [name] [facility] Cardiovascular:  Regular rate and rhythm, no rubs murmurs or gallops.  No Carotid bruits, radial pulse intact. No pedal edema.  Respiratory: No wheezes. Abdominal: No organomegaly, abdomen is soft and non-tender, positive bowel sounds.  No masses. Musculoskeletal: Gait intact. No edema, tenderness Skin: No rashes. Neurologic: Facial musculature symmetric. Psychiatric: Patient acts appropriately throughout our interaction. Lymphatic: No cervical or submandibular lymphadenopathy   Pt in with puffyness of theupper and lowre lips Redness of the posterior oro No stridor  riffuse erythematous chest and top of back   LABS:    EKG/XRAY:   Primary read interpreted by Dr. Everlene Farrier at [facility].   ASSESSMENT/PLAN: Pt  had a moon pie ice cream soon after words developed itching and burning of her skin burning, followed by difficulty breathing, vocal hoarseness and SOB. On arrival pt was given 125 solumedrol IV, 50mg  benadryl IM. On arrival of EMS pt was given 0.3mg  epi and 25mg  additional benadryl IV. Pt on monitor will be transported to hospital for further evaluation.  I personally performed the services described in this documentation, which was scribed in my presence. The recorded information has been reviewed and is accurate. Gross sideeffects, risk and benefits, and alternatives of medications d/w patient. Patient is aware that all medications have potential sideeffects and we are unable  to predict every sideeffect or drug-drug interaction that may occur.  Arlyss Queen MD 03/19/2016 3:36 PM

## 2016-04-28 ENCOUNTER — Other Ambulatory Visit (HOSPITAL_COMMUNITY): Payer: Self-pay | Admitting: Internal Medicine

## 2016-04-28 DIAGNOSIS — E041 Nontoxic single thyroid nodule: Secondary | ICD-10-CM

## 2016-05-05 ENCOUNTER — Ambulatory Visit (HOSPITAL_COMMUNITY): Payer: Managed Care, Other (non HMO)

## 2016-05-09 ENCOUNTER — Ambulatory Visit (HOSPITAL_COMMUNITY): Payer: Managed Care, Other (non HMO)

## 2016-05-23 ENCOUNTER — Ambulatory Visit (HOSPITAL_COMMUNITY): Admission: RE | Admit: 2016-05-23 | Payer: Managed Care, Other (non HMO) | Source: Ambulatory Visit

## 2016-06-03 ENCOUNTER — Ambulatory Visit (HOSPITAL_COMMUNITY)
Admission: RE | Admit: 2016-06-03 | Discharge: 2016-06-03 | Disposition: A | Discharge status: Home / Self Care | Payer: Managed Care, Other (non HMO) | Source: Ambulatory Visit | Attending: Internal Medicine | Admitting: Internal Medicine

## 2016-06-03 DIAGNOSIS — E041 Nontoxic single thyroid nodule: Secondary | ICD-10-CM

## 2017-01-31 ENCOUNTER — Other Ambulatory Visit: Payer: Self-pay | Admitting: Internal Medicine

## 2017-01-31 DIAGNOSIS — Z1231 Encounter for screening mammogram for malignant neoplasm of breast: Secondary | ICD-10-CM

## 2017-02-20 ENCOUNTER — Ambulatory Visit: Payer: Managed Care, Other (non HMO)

## 2017-03-10 ENCOUNTER — Ambulatory Visit: Payer: Managed Care, Other (non HMO)

## 2017-03-31 ENCOUNTER — Ambulatory Visit
Admission: RE | Admit: 2017-03-31 | Discharge: 2017-03-31 | Disposition: A | Discharge status: Home / Self Care | Payer: Commercial Managed Care - PPO | Source: Ambulatory Visit | Attending: Internal Medicine | Admitting: Internal Medicine

## 2017-03-31 DIAGNOSIS — Z1231 Encounter for screening mammogram for malignant neoplasm of breast: Secondary | ICD-10-CM

## 2018-04-30 ENCOUNTER — Other Ambulatory Visit: Payer: Self-pay | Admitting: Internal Medicine

## 2018-04-30 DIAGNOSIS — Z1231 Encounter for screening mammogram for malignant neoplasm of breast: Secondary | ICD-10-CM

## 2018-05-17 ENCOUNTER — Ambulatory Visit
Admission: RE | Admit: 2018-05-17 | Discharge: 2018-05-17 | Disposition: A | Discharge status: Home / Self Care | Payer: Managed Care, Other (non HMO) | Source: Ambulatory Visit | Attending: Internal Medicine | Admitting: Internal Medicine

## 2018-05-17 DIAGNOSIS — Z1231 Encounter for screening mammogram for malignant neoplasm of breast: Secondary | ICD-10-CM

## 2018-05-29 ENCOUNTER — Emergency Department (HOSPITAL_COMMUNITY)
Admission: EM | Admit: 2018-05-29 | Discharge: 2018-05-29 | Disposition: A | Discharge status: Home / Self Care | Payer: Managed Care, Other (non HMO) | Attending: Emergency Medicine | Admitting: Emergency Medicine

## 2018-05-29 ENCOUNTER — Other Ambulatory Visit: Payer: Self-pay

## 2018-05-29 ENCOUNTER — Encounter (HOSPITAL_COMMUNITY): Payer: Self-pay | Admitting: Emergency Medicine

## 2018-05-29 DIAGNOSIS — I10 Essential (primary) hypertension: Secondary | ICD-10-CM | POA: Diagnosis not present

## 2018-05-29 DIAGNOSIS — T7840XA Allergy, unspecified, initial encounter: Secondary | ICD-10-CM | POA: Diagnosis not present

## 2018-05-29 DIAGNOSIS — Z91010 Allergy to peanuts: Secondary | ICD-10-CM | POA: Insufficient documentation

## 2018-05-29 DIAGNOSIS — Z79899 Other long term (current) drug therapy: Secondary | ICD-10-CM | POA: Insufficient documentation

## 2018-05-29 MED ORDER — DIPHENHYDRAMINE HCL 25 MG PO CAPS: 25.0000 mg | ORAL_CAPSULE | Freq: Four times a day (QID) | ORAL | 0 refills | Status: AC | PRN

## 2018-05-29 MED ORDER — METHYLPREDNISOLONE SODIUM SUCC 125 MG IJ SOLR
125.0000 mg | Freq: Once | INTRAMUSCULAR | Status: AC
  Administered 2018-05-29: 125 mg via INTRAVENOUS
  Filled 2018-05-29: qty 2

## 2018-05-29 MED ORDER — PREDNISONE 10 MG PO TABS: 50.0000 mg | ORAL_TABLET | Freq: Every day | ORAL | 0 refills | Status: AC

## 2018-05-29 MED ORDER — FAMOTIDINE IN NACL 20-0.9 MG/50ML-% IV SOLN
20.0000 mg | Freq: Once | INTRAVENOUS | Status: AC
  Administered 2018-05-29: 20 mg via INTRAVENOUS
  Filled 2018-05-29: qty 50

## 2018-05-29 MED ORDER — DIPHENHYDRAMINE HCL 50 MG/ML IJ SOLN
25.0000 mg | Freq: Once | INTRAMUSCULAR | Status: AC
  Administered 2018-05-29: 25 mg via INTRAVENOUS
  Filled 2018-05-29: qty 1

## 2018-05-29 MED ORDER — EPINEPHRINE 0.3 MG/0.3ML IJ SOAJ
0.3000 mg | Freq: Once | INTRAMUSCULAR | Status: DC
  Filled 2018-05-29: qty 0.3

## 2018-05-29 MED ORDER — SODIUM CHLORIDE 0.9 % IV BOLUS
500.0000 mL | Freq: Once | INTRAVENOUS | Status: AC
  Administered 2018-05-29: 500 mL via INTRAVENOUS

## 2018-05-29 NOTE — ED Provider Notes (Signed)
Patient placed in Quick Look pathway, seen and evaluated   Chief Complaint: Pt complains of having an allergic reaction. Possible peanuts   HPI:   Lips swollen mouth feels swollen  ROS: no fever, no shortness of brath  Physical Exam:   Gen: No distress  Neuro: Awake and Alert  Skin: Warm    Focused Exam: Lungs clear, heart rrr   Initiation of care has begun. The patient has been counseled on the process, plan, and necessity for staying for the completion/evaluation, and the remainder of the medical screening examination   Sidney Ace 05/29/18 [place], Gwenyth Allegra, MD 05/30/18 [phone]

## 2018-05-29 NOTE — ED Triage Notes (Signed)
Pt. Stated, no problem swallowing.. My breaths are short.

## 2018-05-29 NOTE — ED Triage Notes (Signed)
Pt. Stated, I started having right facial swelling and my mouth about 30 min. I had some type of Lavender oil put on my skin. The only thing different.

## 2018-05-29 NOTE — Discharge Instructions (Signed)
We saw you in the ER after you had the allergic reaction.  The reaction is severe, however, it appears to be in control and there is no increased swelling or any difficulty in breathing noted. We are not sure what caused the reaction, and it is important for you to follow up with an allergist. Please take the medications prescribed. PLEASE RETURN TO THE ER IMMEDIATELY IN CASE YOU START HAVING WORSENING SWELLING, DIFFICULTY IN BREATHING ETC.  

## 2018-06-07 NOTE — ED Provider Notes (Signed)
[facility] EMERGENC[facility] DEPARTMENT Provider Note   CSN: [id number] Arrival date & time: 05/29/18  1513     History   Chief Complaint Chief Complaint  Patient presents with  . Allergic Reaction  . Facial Swelling    right    HPI [name] is a 57 y.o. female.  HPI 57 year old female comes in with chief complaint of allergic reaction. Patient reports that prior to ED arrival she started having swelling to the right side of her face.  Patient reports applying new oil to her skin.  She also reports that she had peach, which was mixed in a box with bags of peanuts -and she has known allergic reaction to peanuts.  Patient also felt like her throat was swelling and she was having worsening difficulty breathing therefore she decided to come to the ED immediately. Patient denies any nausea, wheezing.  Past Medical History:  Diagnosis Date  . ALLERGIC RHINITIS   . ANEMIA-NOS   . GERD   . Headache(784.0)    chronic  . HYPERLIPIDEMIA    TGs, never on med rx  . HYPERTENSION   . LIPOMA    R shoulder s/p excision 12/2010  . Thyroid nodule    stable [place] 09/2009 and 11/2011    Patient Active Problem List   Diagnosis Date Noted  . 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 HEADACHE 12/22/2010  . 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 HYPERLIPIDEMIA 09/21/2010  . ANEMIA-NOS 09/21/2010  . HYPERTENSION 09/21/2010  . ALLERGIC RHINITIS 09/21/2010  . GERD 09/21/2010    Past Surgical History:  Procedure Laterality Date  . (R) shoulder lipoma excision  12/2010   [facility]  . ABDOMINAL HYSTERECTOMY  2007  . COLONOSCOPY N/A 12/31/2013   Procedure: COLONOSCOPY;  Surgeon: Garlan Fair, MD;  Location: [facility];  Service: Endoscopy;  Laterality: N/A;     OB History    Gravida  2   Para  2   Term  2   Preterm      AB      Living  2     SAB      TAB      Ectopic      Multiple      Live Births               Home Medications    Prior to Admission medications     Medication Sig Start Date End Date Taking? Authorizing Provider  acetaminophen (TYLENOL) 500 MG tablet Take 1,000 mg by mouth every 6 (six) hours as needed for mild pain or moderate pain.    [provider]  aspirin-acetaminophen-caffeine (EXCEDRIN MIGRAINE) [phone] MG tablet Take 2 tablets by mouth every 6 (six) hours as needed for migraine.    [provider]  cetirizine (ZYRTEC) 10 MG tablet Take 10 mg by mouth daily.    [provider]  diphenhydrAMINE (BENADRYL) 25 mg capsule Take 1 capsule (25 mg total) by mouth every 6 (six) hours as needed for itching. 05/29/18   Varney Biles, MD  EPINEPHrine (EPIPEN 2-PAK) 0.3 mg/0.3 mL IJ SOAJ injection Inject 0.3 mLs (0.3 mg total) into the muscle once as needed (for severe allergic reaction). CAll 911 immediately if you have to use this medicine 03/19/16   Pisciotta, Elmyra Ricks, PA-C  fluticasone (FLONASE) 50 MCG/ACT nasal spray Place 1 spray into both nostrils daily.    [provider]  Glucosamine-Chondroit-Vit C-Mn (GLUCOSAMINE-CHONDROITIN) TABS Take 1 tablet by mouth daily.    [provider]  loratadine (CLARITIN) 10 MG tablet Take 10 mg by mouth daily.    [provider]  Multiple Vitamin (MULTIVITAMIN) tablet Take 1 tablet by mouth daily.    [provider]  Omega-3 Fatty Acids (FISH OIL PO) Take 1 capsule by mouth daily.    [provider]  omeprazole (PRILOSEC) 40 MG capsule Take 40 mg by mouth daily. 02/12/16   [provider]  predniSONE (DELTASONE) 10 MG tablet Take 5 tablets (50 mg total) by mouth daily. 05/29/18   Varney Biles, MD  ranitidine (ZANTAC) 150 MG tablet Take 150 mg by mouth 2 (two) times daily as needed for heartburn.     [provider]  VITAMIN E PO Take 1 tablet by mouth daily.    [provider]    Family History Family History  Problem Relation Age of Onset  . Arthritis Other        grandparents  . Breast cancer Other         other relative  . Diabetes Other        grandparents  . Hyperlipidemia Other        grandparents  . Hypertension Other        grandparent & parents  . Thyroid disease Brother   . Transient ischemic attack Sister   . Breast cancer Maternal Aunt 49  . Breast cancer Cousin 17    Social History Social History   Tobacco Use  . Smoking status: Never Smoker  . Smokeless tobacco: Never Used  . Tobacco comment: Divorced, lives alone -  Substance Use Topics  . Alcohol use: Yes    Alcohol/week: 0.0 oz    Comment: occ  . Drug use: No     Allergies   Imodium [loperamide]; Peanut-containing drug products; Penicillins; Codeine; Doxycycline; and Percocet [oxycodone-acetaminophen]   Review of Systems Review of Systems  Constitutional: Positive for activity change.  Respiratory: Positive for shortness of breath. Negative for wheezing.   Gastrointestinal: Negative for nausea and vomiting.  Skin: Positive for rash.  Allergic/Immunologic: Positive for food allergies.  All other systems reviewed and are negative.    Physical Exam Updated Vital Signs BP (!) 164/100 (BP Location: Left Arm)   Pulse 87   Temp 98.4 F (36.9 C) (Oral)   Resp 19   Ht 5\' 8"  (1.727 m)   Wt 84.8 kg (187 lb)   SpO2 100%   BMI 28.43 kg/m   Physical Exam  Constitutional: She is oriented to person, place, and time. She appears well-developed.  HENT:  Head: Normocephalic and atraumatic.  Mild right-sided facial swelling. Oral exam is normal.  Eyes: EOM are normal.  Neck: Normal range of motion. Neck supple.  Cardiovascular: Normal rate.  Pulmonary/Chest: Effort normal. No stridor. She has no wheezes.  Abdominal: Bowel sounds are normal.  Neurological: She is alert and oriented to person, place, and time.  Skin: Skin is warm and dry.  Nursing note and vitals reviewed.    ED Treatments / Results  Labs (all labs ordered are listed, but only abnormal results are displayed) Labs Reviewed - No  data to display  EKG EKG Interpretation  Date/Time:  Tuesday May 29 2018 16:13:57 EDT Ventricular Rate:  83 PR Interval:  130 QRS Duration: 92 QT Interval:  392 QTC Calculation: 461 R Axis:   73 Text Interpretation:  Sinus rhythm No acute changes Nonspecific ST and T wave abnormality No old tracing to compare Confirmed by Varney Biles [phone]) on 05/29/2018 4:48:38 PM  Radiology No results found.  Procedures Procedures (including critical care time)  Medications Ordered in ED Medications  sodium chloride 0.9 % bolus 500 mL (0 mLs Intravenous Stopped 05/29/18 1714)  methylPREDNISolone sodium succinate (SOLU-MEDROL) 125 mg/2 mL injection 125 mg (125 mg Intravenous Given 05/29/18 1611)  famotidine (PEPCID) IVPB 20 mg premix (0 mg Intravenous Stopped 05/29/18 1707)  diphenhydrAMINE (BENADRYL) injection 25 mg (25 mg Intravenous Given 05/29/18 1647)     Initial Impression / Assessment and Plan / ED Course  I have reviewed the triage vital signs and the nursing notes.  Pertinent labs & imaging results that were available during my care of the patient were reviewed by me and considered in my medical decision making (see chart for details).     57 year old with history of anaphylaxis to peanuts comes in with chief complaint of right-sided facial swelling.  Patient is also feeling like her throat is closing.  On exam however she does not have any evidence of respiratory distress, oral swelling, stridor and her O2 sats are normal.  Patient also did not have any wheezing.  She did have mild right-sided facial swelling.  She was given IV Solu-Medrol along with H1 and H2 blocker.  Patient started feeling well soon after, and remained normal in the ED.  She was discharged with strict ER return precautions.  Final Clinical Impressions(s) / ED Diagnoses   Final diagnoses:  Allergic reaction, initial encounter    ED Discharge Orders        Ordered    predniSONE (DELTASONE) 10 MG tablet   Daily     05/29/18 1811    diphenhydrAMINE (BENADRYL) 25 mg capsule  Every 6 hours PRN     05/29/18 1811       Varney Biles, MD 06/07/18 [phone]

## 2019-05-16 ENCOUNTER — Other Ambulatory Visit: Payer: Self-pay | Admitting: Internal Medicine

## 2019-05-16 DIAGNOSIS — Z1231 Encounter for screening mammogram for malignant neoplasm of breast: Secondary | ICD-10-CM

## 2019-06-26 ENCOUNTER — Ambulatory Visit: Payer: Managed Care, Other (non HMO)

## 2019-11-20 ENCOUNTER — Ambulatory Visit: Payer: Managed Care, Other (non HMO) | Attending: Internal Medicine

## 2019-11-20 DIAGNOSIS — Z20822 Contact with and (suspected) exposure to covid-19: Secondary | ICD-10-CM

## 2019-11-21 LAB — NOVEL CORONAVIRUS, NAA: SARS-CoV-2, NAA: NOT DETECTED

## 2021-06-01 ENCOUNTER — Other Ambulatory Visit (HOSPITAL_COMMUNITY): Payer: Self-pay | Admitting: Gastroenterology

## 2021-06-01 DIAGNOSIS — R112 Nausea with vomiting, unspecified: Secondary | ICD-10-CM

## 2021-06-11 ENCOUNTER — Other Ambulatory Visit: Payer: Self-pay

## 2021-06-11 ENCOUNTER — Encounter (HOSPITAL_COMMUNITY)
Admission: RE | Admit: 2021-06-11 | Discharge: 2021-06-11 | Disposition: A | Discharge status: Home / Self Care | Source: Ambulatory Visit | Attending: Gastroenterology | Admitting: Gastroenterology

## 2021-06-11 DIAGNOSIS — R112 Nausea with vomiting, unspecified: Secondary | ICD-10-CM | POA: Diagnosis not present

## 2021-06-11 MED ORDER — TECHNETIUM TC 99M MEBROFENIN IV KIT
5.5000 | PACK | Freq: Once | INTRAVENOUS | Status: AC | PRN
  Administered 2021-06-11: 5.5 via INTRAVENOUS

## 2022-08-11 ENCOUNTER — Ambulatory Visit (INDEPENDENT_AMBULATORY_CARE_PROVIDER_SITE_OTHER): Admitting: Podiatry

## 2022-08-11 ENCOUNTER — Encounter: Payer: Self-pay | Admitting: Podiatry

## 2022-08-11 DIAGNOSIS — B351 Tinea unguium: Secondary | ICD-10-CM | POA: Diagnosis not present

## 2022-08-11 MED ORDER — TERBINAFINE HCL 250 MG PO TABS: 250.0000 mg | ORAL_TABLET | Freq: Every day | ORAL | 0 refills | Status: DC

## 2022-08-14 NOTE — Progress Notes (Signed)
  Subjective:  Patient ID: [name], female    DOB: 1961-10-30,  MRN: [id number]  Chief Complaint  Patient presents with   Nail Problem    Thick painful toenails, "nail is growing straight up instead of out" bilateral great toenails are very painful, she cannot wear closed toe shoes. She has on nail polish today, but has pictures of her nails without polish    61 y.o. female presents with the above complaint. History confirmed with patient.   Objective:  Physical Exam: warm, good capillary refill, no trophic changes or ulcerative lesions, normal DP and PT pulses, normal sensory exam, onychomycosis, and dystrophic nail  Assessment:   1. Onychomycosis      Plan:  Patient was evaluated and treated and all questions answered.  Discussed etiology and treatment options of onychomycosis and nail dystrophy.  Recommended oral treatment she has no contraindications for this.  Do not think topical treatments will be effective with the level of thickness.  We discussed laser treatment as well.  I will see her back in 4 months for follow-up.  Advised to remove nail polish for next appointment to evaluate  Return in about 4 months (around 12/12/2022) for follow up after nail fungus treatment.

## 2022-09-26 ENCOUNTER — Telehealth: Payer: Self-pay | Admitting: *Deleted

## 2022-09-26 MED ORDER — FLUCONAZOLE 150 MG PO TABS: 150.0000 mg | ORAL_TABLET | ORAL | 0 refills | Status: AC

## 2022-09-26 NOTE — Telephone Encounter (Signed)
Patient is calling because after starting the terbinafine, developed diarrhea, headaches, cramping, has discontinued on Friday, headaches has stopped but still have diarrhea, can something else be prescribed?

## 2022-09-26 NOTE — Telephone Encounter (Signed)
Called and updated patient that a prescription was sent in and his instructions not to start until diarrhea resolves. She verbalized understanding.

## 2022-12-13 ENCOUNTER — Ambulatory Visit (INDEPENDENT_AMBULATORY_CARE_PROVIDER_SITE_OTHER): Admitting: Podiatry

## 2022-12-13 DIAGNOSIS — B351 Tinea unguium: Secondary | ICD-10-CM | POA: Diagnosis not present

## 2022-12-13 MED ORDER — EFINACONAZOLE 10 % EX SOLN: 1.0000 [drp] | Freq: Every day | CUTANEOUS | 11 refills | Status: AC

## 2022-12-13 NOTE — Patient Instructions (Addendum)
Call [phone] to talk to [name] about the prescription

## 2022-12-14 ENCOUNTER — Encounter: Payer: Self-pay | Admitting: Podiatry

## 2022-12-14 NOTE — Progress Notes (Signed)
  Subjective:  Patient ID: [name], female    DOB: [birth year],  MRN: [id number]  Chief Complaint  Patient presents with   Nail Problem    Thick painful toenails, 4 month follow up after fungus treatment - patient had reaction to the medication we gave her so she had to stop it. She called for an alternative but did not receive a call back - she has been using OTC medication and her nails do look better    62 y.o. female presents with the above complaint. History confirmed with patient.   Objective:  Physical Exam: warm, good capillary refill, no trophic changes or ulcerative lesions, normal DP and PT pulses, normal sensory exam, onychomycosis, and dystrophic nail  Assessment:   1. Onychomycosis      Plan:  Patient was evaluated and treated and all questions answered.  Understandably there have been no changes.  She is frustrated that she did not receive a call to let her know the new medication was sent in.  I did confirm that it was sent to the the pharmacy but this was the incorrect pharmacy.  We discussed the alternative of topical medication.  We discussed the option of Jublia.  I sent this to a specialty pharmacy for her, discussed with her that with [facility] this may not be covered but we will see what we can do.  If it is not covered she will let me know and we will plan to use fluconazole pulsed dosing  Return in about 4 months (around 04/13/2023) for follow up after nail fungus treatment.

## 2022-12-28 ENCOUNTER — Encounter: Payer: Self-pay | Admitting: Family Medicine

## 2022-12-28 ENCOUNTER — Other Ambulatory Visit: Payer: Self-pay | Admitting: Family Medicine

## 2022-12-28 DIAGNOSIS — Z1231 Encounter for screening mammogram for malignant neoplasm of breast: Secondary | ICD-10-CM

## 2023-03-23 ENCOUNTER — Encounter (HOSPITAL_BASED_OUTPATIENT_CLINIC_OR_DEPARTMENT_OTHER): Payer: Self-pay | Admitting: Pediatrics

## 2023-03-23 ENCOUNTER — Other Ambulatory Visit: Payer: Self-pay

## 2023-03-23 ENCOUNTER — Emergency Department (HOSPITAL_BASED_OUTPATIENT_CLINIC_OR_DEPARTMENT_OTHER)

## 2023-03-23 ENCOUNTER — Emergency Department (HOSPITAL_BASED_OUTPATIENT_CLINIC_OR_DEPARTMENT_OTHER)
Admission: EM | Admit: 2023-03-23 | Discharge: 2023-03-23 | Disposition: A | Discharge status: Home / Self Care | Attending: Emergency Medicine | Admitting: Emergency Medicine

## 2023-03-23 DIAGNOSIS — I10 Essential (primary) hypertension: Secondary | ICD-10-CM | POA: Insufficient documentation

## 2023-03-23 DIAGNOSIS — Z7982 Long term (current) use of aspirin: Secondary | ICD-10-CM | POA: Diagnosis not present

## 2023-03-23 DIAGNOSIS — R519 Headache, unspecified: Secondary | ICD-10-CM | POA: Diagnosis present

## 2023-03-23 DIAGNOSIS — R202 Paresthesia of skin: Secondary | ICD-10-CM | POA: Insufficient documentation

## 2023-03-23 DIAGNOSIS — Z91010 Allergy to peanuts: Secondary | ICD-10-CM | POA: Diagnosis not present

## 2023-03-23 LAB — CBC
HCT: 43.6 % (ref 36.0–46.0)
Hemoglobin: 15 g/dL (ref 12.0–15.0)
MCH: 27.1 pg (ref 26.0–34.0)
MCHC: 34.4 g/dL (ref 30.0–36.0)
MCV: 78.8 fL — ABNORMAL LOW (ref 80.0–100.0)
Platelets: 259 10*3/uL (ref 150–400)
RBC: 5.53 MIL/uL — ABNORMAL HIGH (ref 3.87–5.11)
RDW: 13.5 % (ref 11.5–15.5)
WBC: 6.2 10*3/uL (ref 4.0–10.5)
nRBC: 0 % (ref 0.0–0.2)

## 2023-03-23 LAB — URINALYSIS, ROUTINE W REFLEX MICROSCOPIC
Bilirubin Urine: NEGATIVE
Glucose, UA: NEGATIVE mg/dL
Hgb urine dipstick: NEGATIVE
Ketones, ur: NEGATIVE mg/dL
Leukocytes,Ua: NEGATIVE
Nitrite: NEGATIVE
Protein, ur: NEGATIVE mg/dL
Specific Gravity, Urine: 1.01 (ref 1.005–1.030)
pH: 7 (ref 5.0–8.0)

## 2023-03-23 LAB — COMPREHENSIVE METABOLIC PANEL
ALT: 30 U/L (ref 0–44)
AST: 23 U/L (ref 15–41)
Albumin: 5.4 g/dL — ABNORMAL HIGH (ref 3.5–5.0)
Alkaline Phosphatase: 49 U/L (ref 38–126)
Anion gap: 13 (ref 5–15)
BUN: 14 mg/dL (ref 6–20)
CO2: 24 mmol/L (ref 22–32)
Calcium: 11.4 mg/dL — ABNORMAL HIGH (ref 8.9–10.3)
Chloride: 103 mmol/L (ref 98–111)
Creatinine, Ser: 0.85 mg/dL (ref 0.44–1.00)
GFR, Estimated: >60 mL/min (ref >60)
Glucose, Bld: 123 mg/dL — ABNORMAL HIGH (ref 70–99)
Potassium: 3.4 mmol/L — ABNORMAL LOW (ref 3.5–5.1)
Sodium: 140 mmol/L (ref 135–145)
Total Bilirubin: 0.8 mg/dL (ref 0.3–1.2)
Total Protein: 8.7 g/dL — ABNORMAL HIGH (ref 6.5–8.1)

## 2023-03-23 LAB — PROTIME-INR
INR: 1 (ref 0.8–1.2)
Prothrombin Time: 13.3 seconds (ref 11.4–15.2)

## 2023-03-23 LAB — DIFFERENTIAL
Abs Immature Granulocytes: 0.01 10*3/uL (ref 0.00–0.07)
Basophils Absolute: 0 10*3/uL (ref 0.0–0.1)
Basophils Relative: 1 %
Eosinophils Absolute: 0.2 10*3/uL (ref 0.0–0.5)
Eosinophils Relative: 3 %
Immature Granulocytes: 0 %
Lymphocytes Relative: 42 %
Lymphs Abs: 2.6 10*3/uL (ref 0.7–4.0)
Monocytes Absolute: 0.4 10*3/uL (ref 0.1–1.0)
Monocytes Relative: 7 %
Neutro Abs: 2.9 10*3/uL (ref 1.7–7.7)
Neutrophils Relative %: 47 %

## 2023-03-23 LAB — RAPID URINE DRUG SCREEN, HOSP PERFORMED
Amphetamines: NOT DETECTED
Barbiturates: NOT DETECTED
Benzodiazepines: NOT DETECTED
Cocaine: NOT DETECTED
Opiates: NOT DETECTED
Tetrahydrocannabinol: POSITIVE — AB

## 2023-03-23 LAB — TROPONIN I (HIGH SENSITIVITY): Troponin I (High Sensitivity): 2 ng/L (ref <18)

## 2023-03-23 LAB — ETHANOL: Alcohol, Ethyl (B): <10 mg/dL (ref <10)

## 2023-03-23 LAB — APTT: aPTT: 26 seconds (ref 24–36)

## 2023-03-23 MED ORDER — SODIUM CHLORIDE 0.9 % IV BOLUS
1000.0000 mL | Freq: Once | INTRAVENOUS | Status: AC
  Administered 2023-03-23: 1000 mL via INTRAVENOUS

## 2023-03-23 MED ORDER — DEXAMETHASONE SODIUM PHOSPHATE 10 MG/ML IJ SOLN
10.0000 mg | Freq: Once | INTRAMUSCULAR | Status: AC
  Administered 2023-03-23: 10 mg via INTRAVENOUS
  Filled 2023-03-23: qty 1

## 2023-03-23 MED ORDER — METOCLOPRAMIDE HCL 5 MG/ML IJ SOLN
10.0000 mg | Freq: Once | INTRAMUSCULAR | Status: AC
  Administered 2023-03-23: 10 mg via INTRAVENOUS
  Filled 2023-03-23: qty 2

## 2023-03-23 MED ORDER — IOHEXOL 350 MG/ML SOLN
100.0000 mL | Freq: Once | INTRAVENOUS | Status: AC | PRN
  Administered 2023-03-23: 75 mL via INTRAVENOUS

## 2023-03-23 MED ORDER — DIPHENHYDRAMINE HCL 50 MG/ML IJ SOLN
25.0000 mg | Freq: Once | INTRAMUSCULAR | Status: AC
  Administered 2023-03-23: 25 mg via INTRAVENOUS
  Filled 2023-03-23: qty 1

## 2023-03-23 MED ORDER — KETOROLAC TROMETHAMINE 15 MG/ML IJ SOLN
15.0000 mg | Freq: Once | INTRAMUSCULAR | Status: AC
  Administered 2023-03-23: 15 mg via INTRAVENOUS
  Filled 2023-03-23: qty 1

## 2023-03-23 NOTE — ED Triage Notes (Signed)
C/O numbness on left side of face and arms started an hour ago,

## 2023-03-23 NOTE — ED Provider Notes (Signed)
[place] EMERGENCY DEPARTMENT AT [facility] Provider Note   CSN: [id number] Arrival date & time: 03/23/23  1337     History  Chief Complaint  Patient presents with   Numbness    [name] is a 62 y.o. female.  [name] is a 62 y.o. female with history of hypertension, hyperlipidemia, anemia and GERD, who presents to the emergency department for evaluation of headache and numbness.  Patient brought in by her coworker.  She reports she was working at her desk when she noticed numbness and tingling sensation on the left side of her face and her arms.  The symptoms started 1 hour ago and were followed by sudden onset of a severe headache.  Patient denies any vision changes, no slurred speech noted and the coworker at bedside confirms that speech has been normal.  Patient had initially reported some weakness but later denies and reports numbness.  No dizziness or syncope.  Patient is very anxious and panicked by the symptoms, hyperventilating and tearful reporting that she just does not feel right.  She has a history of headaches that she has never been evaluated before but has never had associated numbness or tingling.  She also reports some chest tightness that started after other symptoms when she began feeling anxious.  No shortness of breath, no syncope.  The history is provided by the patient.       Home Medications Prior to Admission medications   Medication Sig Start Date End Date Taking? Authorizing Provider  acetaminophen (TYLENOL) 500 MG tablet Take 1,000 mg by mouth every 6 (six) hours as needed for mild pain or moderate pain.    [provider]  aspirin-acetaminophen-caffeine (EXCEDRIN MIGRAINE) [phone] MG tablet Take 2 tablets by mouth every 6 (six) hours as needed for migraine.    [provider]  cetirizine (ZYRTEC) 10 MG tablet Take 10 mg by mouth daily.    [provider]  diphenhydrAMINE (BENADRYL) 25 mg capsule Take  1 capsule (25 mg total) by mouth every 6 (six) hours as needed for itching. 05/29/18   Derwood Kaplan, MD  Efinaconazole 10 % SOLN Apply 1 drop topically daily. 12/13/22   McDonald, Rachelle Hora, DPM  EPINEPHrine (EPIPEN 2-PAK) 0.3 mg/0.3 mL IJ SOAJ injection Inject 0.3 mLs (0.3 mg total) into the muscle once as needed (for severe allergic reaction). CAll 911 immediately if you have to use this medicine 03/19/16   Pisciotta, Joni Reining, PA-C  fluconazole (DIFLUCAN) 150 MG tablet Take 1 tablet (150 mg total) by mouth once a week. 09/26/22   McDonald, Rachelle Hora, DPM  fluticasone (FLONASE) 50 MCG/ACT nasal spray Place 1 spray into both nostrils daily.    [provider]  Glucosamine-Chondroit-Vit C-Mn (GLUCOSAMINE-CHONDROITIN) TABS Take 1 tablet by mouth daily.    [provider]  loratadine (CLARITIN) 10 MG tablet Take 10 mg by mouth daily.    [provider]  Multiple Vitamin (MULTIVITAMIN) tablet Take 1 tablet by mouth daily.    [provider]  Omega-3 Fatty Acids (FISH OIL PO) Take 1 capsule by mouth daily.    [provider]  omeprazole (PRILOSEC) 40 MG capsule Take 40 mg by mouth daily. 02/12/16   [provider]  predniSONE (DELTASONE) 10 MG tablet Take 5 tablets (50 mg total) by mouth daily. 05/29/18   Derwood Kaplan, MD  ranitidine (ZANTAC) 150 MG tablet Take 150 mg by mouth 2 (two) times daily as needed for heartburn.  [provider]  VITAMIN E PO Take 1 tablet by mouth daily.    [provider]      Allergies    Imodium [loperamide], Peanut-containing drug products, Penicillins, Codeine, Doxycycline, and Percocet [oxycodone-acetaminophen]    Review of Systems   Review of Systems  Constitutional:  Negative for chills and fever.  HENT:  Negative for congestion, rhinorrhea and sore throat.   Eyes:  Positive for photophobia. Negative for pain and visual disturbance.  Respiratory:  Positive for chest tightness. Negative for  shortness of breath.   Cardiovascular:  Positive for chest pain.  Gastrointestinal:  Negative for abdominal pain, nausea and vomiting.  Genitourinary:  Negative for dysuria and frequency.  Skin:  Negative for color change and rash.  Neurological:  Positive for numbness and headaches. Negative for dizziness, seizures, syncope, facial asymmetry, speech difficulty, weakness and light-headedness.  Psychiatric/Behavioral:  The patient is nervous/anxious.     Physical Exam Updated Vital Signs BP (!) 166/104 (BP Location: Right Arm)   Pulse 76   Temp 98.6 F (37 C) (Oral)   Resp 12   Ht 5\' 8"  (1.727 m)   Wt 72.6 kg   SpO2 100%   BMI 24.33 kg/m  Physical Exam Vitals and nursing note reviewed.  Constitutional:      Appearance: Normal appearance. She is well-developed. She is not ill-appearing or diaphoretic.     Comments: Patient is alert, but is panicked and hyperventilating on arrival, tearful  HENT:     Head: Normocephalic and atraumatic.     Mouth/Throat:     Mouth: Mucous membranes are moist.     Pharynx: Oropharynx is clear.  Eyes:     General:        Right eye: No discharge.        Left eye: No discharge.     Extraocular Movements: Extraocular movements intact.     Pupils: Pupils are equal, round, and reactive to light.     Comments: Pupils equal round and reactive, EOMs intact, full visual fields intact bilaterally  Neck:     Comments: No nuchal rigidity or meningeal signs Cardiovascular:     Rate and Rhythm: Normal rate and regular rhythm.     Pulses: Normal pulses.     Heart sounds: Normal heart sounds.  Pulmonary:     Effort: Pulmonary effort is normal. No respiratory distress.     Breath sounds: Normal breath sounds. No wheezing or rales.     Comments: Patient initially hyperventilating but after some coaching patient able to slow respirations and breathing now equal and unlabored, patient able to speak in full sentences, lungs clear to auscultation bilaterally   Chest:     Chest wall: No tenderness.  Abdominal:     General: Bowel sounds are normal. There is no distension.     Palpations: Abdomen is soft. There is no mass.     Tenderness: There is no abdominal tenderness. There is no guarding.     Comments: Abdomen soft, nondistended, nontender to palpation in all quadrants without guarding or peritoneal signs  Musculoskeletal:        General: No deformity.     Cervical back: Neck supple. No tenderness.  Skin:    General: Skin is warm and dry.     Capillary Refill: Capillary refill takes less than 2 seconds.  Neurological:     Mental Status: She is alert and oriented to person, place, and time.     Coordination: Coordination normal.  Comments: Neurological Exam:  Mental Status: Alert and oriented to person, place, and time. Attention and concentration normal. Speech clear. Recent memory is intact. Follows commands. Cranial Nerves: Visual fields grossly intact. EOMI and PERRLA. No nystagmus noted. Facial sensation intact at forehead, maxillary cheek, and chin/mandible bilaterally. No facial asymmetry or weakness. Hearing grossly normal. Uvula is midline, and palate elevates symmetrically. Normal SCM and trapezius strength. Tongue midline without fasciculations. Motor: Muscle strength 5/5 in proximal and distal UE and LE bilaterally. No pronator drift. Muscle tone normal. Reflexes: 2+ and symmetrical in all four extremities.  Sensation: Intact to light touch in upper and lower extremities distally bilaterally.  Despite reported numbness patient reports equal sensation bilaterally in the face upper and lower extremities. Gait: Normal without ataxia. Coordination: Normal FTN bilaterally.   Psychiatric:        Mood and Affect: Mood normal.        Behavior: Behavior normal.     ED Results / Procedures / Treatments   Labs (all labs ordered are listed, but only abnormal results are displayed) Labs Reviewed  CBC - Abnormal; Notable for the  following components:      Result Value   RBC 5.53 (*)    MCV 78.8 (*)    All other components within normal limits  COMPREHENSIVE METABOLIC PANEL - Abnormal; Notable for the following components:   Potassium 3.4 (*)    Glucose, Bld 123 (*)    Calcium 11.4 (*)    Total Protein 8.7 (*)    Albumin 5.4 (*)    All other components within normal limits  RAPID URINE DRUG SCREEN, HOSP PERFORMED - Abnormal; Notable for the following components:   Tetrahydrocannabinol POSITIVE (*)    All other components within normal limits  URINALYSIS, ROUTINE W REFLEX MICROSCOPIC - Abnormal; Notable for the following components:   Color, Urine STRAW (*)    All other components within normal limits  ETHANOL  PROTIME-INR  APTT  DIFFERENTIAL  TROPONIN I (HIGH SENSITIVITY)    EKG EKG Interpretation  Date/Time:  Thursday Mar 23 2023 13:57:23 EDT Ventricular Rate:  90 PR Interval:  130 QRS Duration: 93 QT Interval:  372 QTC Calculation: 456 R Axis:   63 Text Interpretation: Sinus rhythm Borderline ST depression, lateral leads Confirmed by Marily Memos [phone]) on 03/28/2023 6:35:55 AM  Radiology  CT ANGIO HEAD NECK W WO CM  Result Date: 03/23/2023 CLINICAL DATA:  Stroke, hemorrhagic EXAM: CT ANGIOGRAPHY HEAD AND NECK WITH AND WITHOUT CONTRAST TECHNIQUE: Multidetector CT imaging of the head and neck was performed using the standard protocol during bolus administration of intravenous contrast. Multiplanar CT image reconstructions and MIPs were obtained to evaluate the vascular anatomy. Carotid stenosis measurements (when applicable) are obtained utilizing NASCET criteria, using the distal internal carotid diameter as the denominator. RADIATION DOSE REDUCTION: This exam was performed according to the departmental dose-optimization program which includes automated exposure control, adjustment of the mA and/or kV according to patient size and/or use of iterative reconstruction technique. CONTRAST:  75mL  OMNIPAQUE IOHEXOL 350 MG/ML SOLN COMPARISON:  None Available. FINDINGS: CT HEAD FINDINGS Brain: No evidence of acute infarction, hemorrhage, hydrocephalus, extra-axial collection or mass lesion/mass effect. Vascular: See below Skull: Normal. Negative for fracture or focal lesion. Sinuses/Orbits: No middle ear or mastoid effusion. Paranasal sinuses are clear. Orbits are unremarkable. Other: None. Review of the MIP images confirms the above findings CTA NECK FINDINGS Aortic arch: Standard branching. Imaged portion shows no evidence of aneurysm or dissection. No significant  stenosis of the major arch vessel origins. Right carotid system: No evidence of dissection, stenosis (50% or greater), or occlusion. Left carotid system: No evidence of dissection, stenosis (50% or greater), or occlusion. Vertebral arteries: Left dominant. No evidence of dissection, stenosis (50% or greater), or occlusion. Skeleton: Negative. Other neck: Prominent left level 2A lymph node measuring 8 mm, likely reactive. Upper chest: Negative. Review of the MIP images confirms the above findings CTA HEAD FINDINGS Anterior circulation: No significant stenosis, proximal occlusion, aneurysm, or vascular malformation. Posterior circulation: No significant stenosis, proximal occlusion, aneurysm, or vascular malformation. Venous sinuses: As permitted by contrast timing, patent. Anatomic variants: Fetal PCA on the right. Review of the MIP images confirms the above findings IMPRESSION: 1. No acute intracranial abnormality. 2. No intracranial large vessel occlusion or significant stenosis. 3. No hemodynamically significant stenosis in the neck. Electronically Signed   By: Lorenza Cambridge M.D.   On: 03/23/2023 15:01     Procedures Procedures    Medications Ordered in ED Medications  iohexol (OMNIPAQUE) 350 MG/ML injection 100 mL (75 mLs Intravenous Contrast Given 03/23/23 1415)  sodium chloride 0.9 % bolus 1,000 mL (0 mLs Intravenous Stopped 03/23/23  1723)  dexamethasone (DECADRON) injection 10 mg (10 mg Intravenous Given 03/23/23 1542)  metoCLOPramide (REGLAN) injection 10 mg (10 mg Intravenous Given 03/23/23 1547)  diphenhydrAMINE (BENADRYL) injection 25 mg (25 mg Intravenous Given 03/23/23 1541)  ketorolac (TORADOL) 15 MG/ML injection 15 mg (15 mg Intravenous Given 03/23/23 1542)    ED Course/ Medical Decision Making/ A&P                             Medical Decision Making Amount and/or Complexity of Data Reviewed Labs: ordered. Radiology: ordered.  Risk Prescription drug management.   62 y.o. female presents to the ED with complaints of headache and left-sided numbness, this involves an extensive number of treatment options, and is a complaint that carries with it a high risk of complications and morbidity.  The differential diagnosis includes stroke, subarachnoid hemorrhage, aneurysm, complicated migraine  On arrival pt is nontoxic, vitals significant for elevated blood pressure on arrival, although patient was quite panicked, recheck after patient has calm down the headache treated is significantly improved.. Exam significant for no focal neurologic deficits  Additional history obtained from daughter at bedside. Previous records obtained and reviewed   I ordered medication including IV fluid bolus, Reglan, Toradol, Benadryl and Decadron for treatment of headache  EKG: Sinus rhythm without ischemic changes  Lab Tests:  I Ordered, reviewed, and interpreted labs, which included: No leukocytosis, potassium of 3.4, no other significant electrolyte derangements, mildly elevated calcium, ethanol negative, UDS positive for THC, UA without signs of infection.  Troponin negative, suspect chest tightness in the setting of anxiety and hyperventilation.  Imaging Studies ordered:  I ordered imaging studies which included CT angio of the head and neck, I independently visualized and interpreted imaging which showed no evidence of  intracranial hemorrhage or mass, no evidence of aneurysm or vascular occlusion  ED Course:   I discussed reassuring results from imaging with patient and daughter at bedside.  Patient is much calmer now, neurologic exam remains normal.  Discussed concern for potential complicated migraine given reassuring neurologic exam.  Considered stroke but given that patient has never had any neurologic deficits during presentation and less concern for this.  After headache cocktail patient's paresthesia shows as well as her headache is completely resolved.  Patient  is feeling much better.  At this time feel she is appropriate for outpatient neurology and primary care follow-up.  Discussed return precautions.  Patient expresses understanding and agreement with plan.    Portions of this note were generated with Scientist, clinical (histocompatibility and immunogenetics). Dictation errors may occur despite best attempts at proofreading.         Final Clinical Impression(s) / ED Diagnoses Final diagnoses:  Paresthesia  Bad headache    Rx / DC Orders ED Discharge Orders     None         Legrand Rams 04/09/23 1514    Tegeler, Canary Brim, MD 04/09/23 2026

## 2023-03-23 NOTE — Discharge Instructions (Addendum)
Your CT scan and lab work today were reassuring did not show any evidence of a head bleed, aneurysm or blockage in your vessels.  Suspect your symptoms were caused by a complicated migraine where you can have a severe headache associated with neurologic symptoms such as numbness or visual changes.  You should follow-up with neurology regarding these headaches.  Return for new or worsening symptoms.

## 2023-03-23 NOTE — ED Notes (Signed)
Swallow eval successfully performed.

## 2023-03-23 NOTE — ED Notes (Signed)
Dizziness has resolved. Pt ambulatory to BR. Steady gait

## 2023-04-04 ENCOUNTER — Ambulatory Visit: Payer: Self-pay | Admitting: *Deleted

## 2023-04-04 ENCOUNTER — Ambulatory Visit (HOSPITAL_BASED_OUTPATIENT_CLINIC_OR_DEPARTMENT_OTHER): Admitting: Student

## 2023-04-04 ENCOUNTER — Other Ambulatory Visit: Payer: Self-pay

## 2023-04-04 ENCOUNTER — Emergency Department (HOSPITAL_BASED_OUTPATIENT_CLINIC_OR_DEPARTMENT_OTHER)

## 2023-04-04 ENCOUNTER — Encounter (HOSPITAL_BASED_OUTPATIENT_CLINIC_OR_DEPARTMENT_OTHER): Payer: Self-pay | Admitting: Emergency Medicine

## 2023-04-04 ENCOUNTER — Emergency Department (HOSPITAL_BASED_OUTPATIENT_CLINIC_OR_DEPARTMENT_OTHER)
Admission: EM | Admit: 2023-04-04 | Discharge: 2023-04-04 | Disposition: A | Discharge status: Home / Self Care | Attending: Emergency Medicine | Admitting: Emergency Medicine

## 2023-04-04 DIAGNOSIS — S90122A Contusion of left lesser toe(s) without damage to nail, initial encounter: Secondary | ICD-10-CM | POA: Diagnosis not present

## 2023-04-04 DIAGNOSIS — Z7982 Long term (current) use of aspirin: Secondary | ICD-10-CM | POA: Diagnosis not present

## 2023-04-04 DIAGNOSIS — W2203XA Walked into furniture, initial encounter: Secondary | ICD-10-CM | POA: Insufficient documentation

## 2023-04-04 DIAGNOSIS — Z91010 Allergy to peanuts: Secondary | ICD-10-CM | POA: Diagnosis not present

## 2023-04-04 DIAGNOSIS — M79675 Pain in left toe(s): Secondary | ICD-10-CM | POA: Diagnosis present

## 2023-04-04 DIAGNOSIS — Y9301 Activity, walking, marching and hiking: Secondary | ICD-10-CM | POA: Diagnosis not present

## 2023-04-04 NOTE — Telephone Encounter (Signed)
  Chief Complaint: toe injury Symptoms: hit toe on furniture- L fourth toe- swelling, bruising, pain Frequency: injured last night Pertinent Negatives: Patient denies cuts Disposition: [x] ED /[] Urgent Care (no appt availability in office) / [] Appointment(In office/virtual)/ []  [place] [facility]/ [] Home Care/ [] Refused Recommended Disposition /[] [facility]/ []  Follow-up with PCP Additional Notes: ED ortho care- advised - address and number given for same day clinic

## 2023-04-04 NOTE — ED Notes (Signed)
Pt verbalized frustration about the delay in getting her xray. This RN called xray to determine the status and was told she is next. This RN apologized to the patient and explained the delay. Pt verbalized understanding.

## 2023-04-04 NOTE — ED Provider Notes (Signed)
EMERGENCY DEPARTMENT AT [facility] Provider Note   CSN: [id number] Arrival date & time: 04/04/23  1551     History  Chief Complaint  Patient presents with   Toe Pain    [name] is a 62 y.o. female.  [name] is a 62 year old african [place] female who presents to the ED with chief complaint of left toe pain x 1 day. She stubbed her 4th digit yesterday night after accidentally walking into a piece of furniture. The pain has increased significantly since the injury occurred and is currently rated 9/10. She describes the pain as "sharp" and "shooting" and it radiates to the bottom of her foot and up her lower leg. She tried taking tylenol this morning with no improvement. To note, she broke the 5th digit on her left foot approximately 2 years ago. No other acute complaints at this time.  The history is provided by the patient. No language interpreter was used.  Toe Pain This is a new problem. The problem occurs constantly. The problem has not changed since onset.Pertinent negatives include no chest pain. Nothing aggravates the symptoms. She has tried nothing for the symptoms. The treatment provided no relief.       Home Medications Prior to Admission medications   Medication Sig Start Date End Date Taking? Authorizing Provider  acetaminophen (TYLENOL) 500 MG tablet Take 1,000 mg by mouth every 6 (six) hours as needed for mild pain or moderate pain.    [provider]  aspirin-acetaminophen-caffeine (EXCEDRIN MIGRAINE) [phone] MG tablet Take 2 tablets by mouth every 6 (six) hours as needed for migraine.    [provider]  cetirizine (ZYRTEC) 10 MG tablet Take 10 mg by mouth daily.    [provider]  diphenhydrAMINE (BENADRYL) 25 mg capsule Take 1 capsule (25 mg total) by mouth every 6 (six) hours as needed for itching. 05/29/18   Derwood Kaplan, MD  Efinaconazole 10 % SOLN Apply 1 drop topically daily. 12/13/22   McDonald,  Rachelle Hora, DPM  EPINEPHrine (EPIPEN 2-PAK) 0.3 mg/0.3 mL IJ SOAJ injection Inject 0.3 mLs (0.3 mg total) into the muscle once as needed (for severe allergic reaction). CAll 911 immediately if you have to use this medicine 03/19/16   Pisciotta, Joni Reining, PA-C  fluconazole (DIFLUCAN) 150 MG tablet Take 1 tablet (150 mg total) by mouth once a week. 09/26/22   McDonald, Rachelle Hora, DPM  fluticasone (FLONASE) 50 MCG/ACT nasal spray Place 1 spray into both nostrils daily.    [provider]  Glucosamine-Chondroit-Vit C-Mn (GLUCOSAMINE-CHONDROITIN) TABS Take 1 tablet by mouth daily.    [provider]  loratadine (CLARITIN) 10 MG tablet Take 10 mg by mouth daily.    [provider]  Multiple Vitamin (MULTIVITAMIN) tablet Take 1 tablet by mouth daily.    [provider]  Omega-3 Fatty Acids (FISH OIL PO) Take 1 capsule by mouth daily.    [provider]  omeprazole (PRILOSEC) 40 MG capsule Take 40 mg by mouth daily. 02/12/16   [provider]  predniSONE (DELTASONE) 10 MG tablet Take 5 tablets (50 mg total) by mouth daily. 05/29/18   Derwood Kaplan, MD  ranitidine (ZANTAC) 150 MG tablet Take 150 mg by mouth 2 (two) times daily as needed for heartburn.     [provider]  VITAMIN E PO Take 1 tablet by mouth daily.    [provider]      Allergies    Imodium [loperamide],  Peanut-containing drug products, Penicillins, Codeine, Doxycycline, and Percocet [oxycodone-acetaminophen]    Review of Systems   Review of Systems  Cardiovascular:  Negative for chest pain.  All other systems reviewed and are negative.   Physical Exam Updated Vital Signs BP 114/81 (BP Location: Left Arm)   Pulse 85   Temp 97.7 F (36.5 C)   Resp 18   Ht 5\' 8"  (1.727 m)   Wt 72.6 kg   SpO2 100%   BMI 24.33 kg/m  Physical Exam Vitals reviewed.  Constitutional:      Appearance: Normal appearance.  Musculoskeletal:        General: Swelling and tenderness  present.     Comments: Swollen left fouthe toe,  from toes,  nv and ns intact   Skin:    General: Skin is warm.  Neurological:     General: No focal deficit present.     Mental Status: She is alert.  Psychiatric:        Mood and Affect: Mood normal.     ED Results / Procedures / Treatments   Labs (all labs ordered are listed, but only abnormal results are displayed) Labs Reviewed - No data to display  EKG None  Radiology DG Toe 4th Left  Result Date: 04/04/2023 CLINICAL DATA:  Stubbed toe, pain. Injury to fourth toe today. EXAM: LEFT FOURTH TOE COMPARISON:  None Available. FINDINGS: There is no evidence of fracture or dislocation. There is no evidence of arthropathy or other focal bone abnormality. Soft tissues are unremarkable. The adjacent included digits and metatarsals are also intact without fracture. IMPRESSION: No fracture or dislocation of the fourth toe. Electronically Signed   By: Narda Rutherford M.D.   On: 04/04/2023 18:06    Procedures Procedures    Medications Ordered in ED Medications - No data to display  ED Course/ Medical Decision Making/ A&P                             Medical Decision Making Pt stumped her toe on a sofa    Amount and/or Complexity of Data Reviewed Radiology: ordered and independent interpretation performed. Decision-making details documented in ED Course.    Details: Xray 4th toe,  no fracture            Final Clinical Impression(s) / ED Diagnoses Final diagnoses:  Contusion of left lesser toe(s) w/o damage to nail, init    Rx / DC Orders ED Discharge Orders     None      An After Visit Summary was printed and given to the patient. An After Visit Summary was printed and given to the patient.     Elson Areas, Cordelia Poche 04/04/23 1826    Ernie Avena, MD 04/04/23 2140

## 2023-04-04 NOTE — ED Notes (Signed)
Pt A&OX4 ambulatory at d/c with independent steady gait. Pt verbalized understanding of d/c instructions and follow up care. 

## 2023-04-04 NOTE — Telephone Encounter (Signed)
Reason for Disposition  [1] SEVERE pain AND [2] not improved 2 hours after pain medicine/ice packs  Answer Assessment - Initial Assessment Questions 1. MECHANISM: "How did the injury happen?"      Stumped toe-  2. ONSET: "When did the injury happen?" (Minutes or hours ago)      Last night 3. LOCATION: "What part of the toe is injured?" "Is the nail damaged?"      Left fourth toe 4. APPEARANCE of TOE INJURY: "What does the injury look like?"      Swollen, bruised 5. SEVERITY: "Can you use the foot normally?" "Can you walk?"      Yes- pain 6. SIZE: For cuts, bruises, or swelling, ask: "How large is it?" (e.g., inches or centimeters;  entire toe)      Bruising, swelling 7. PAIN: "Is there pain?" If Yes, ask: "How bad is the pain?"   (e.g., Scale 1-10; or mild, moderate, severe)     8-severe 8. TETANUS: For any breaks in the skin, ask: "When was the last tetanus booster?"     no 9. DIABETES: "Do you have a history of diabetes or poor circulation in the feet?"     no 10. OTHER SYMPTOMS: "Do you have any other symptoms?"        Pain running up the foot to leg  Protocols used: Toe Injury-A-AH

## 2023-04-04 NOTE — ED Triage Notes (Signed)
Patient arrives ambulatory by POV c/o left toe pain after stubbing toe on her furniture.

## 2023-04-13 ENCOUNTER — Ambulatory Visit: Admitting: Podiatry

## 2023-11-23 ENCOUNTER — Emergency Department (HOSPITAL_BASED_OUTPATIENT_CLINIC_OR_DEPARTMENT_OTHER)
Admission: EM | Admit: 2023-11-23 | Discharge: 2023-11-23 | Disposition: A | Discharge status: Home / Self Care | Payer: Self-pay

## 2023-11-23 ENCOUNTER — Encounter (HOSPITAL_BASED_OUTPATIENT_CLINIC_OR_DEPARTMENT_OTHER): Payer: Self-pay | Admitting: Emergency Medicine

## 2023-11-23 ENCOUNTER — Other Ambulatory Visit: Payer: Self-pay

## 2023-11-23 ENCOUNTER — Emergency Department (HOSPITAL_BASED_OUTPATIENT_CLINIC_OR_DEPARTMENT_OTHER): Payer: Self-pay

## 2023-11-23 DIAGNOSIS — R42 Dizziness and giddiness: Secondary | ICD-10-CM | POA: Insufficient documentation

## 2023-11-23 DIAGNOSIS — Z91010 Allergy to peanuts: Secondary | ICD-10-CM | POA: Diagnosis not present

## 2023-11-23 DIAGNOSIS — R0602 Shortness of breath: Secondary | ICD-10-CM | POA: Insufficient documentation

## 2023-11-23 DIAGNOSIS — I10 Essential (primary) hypertension: Secondary | ICD-10-CM | POA: Insufficient documentation

## 2023-11-23 DIAGNOSIS — Z20822 Contact with and (suspected) exposure to covid-19: Secondary | ICD-10-CM | POA: Diagnosis not present

## 2023-11-23 DIAGNOSIS — R002 Palpitations: Secondary | ICD-10-CM | POA: Insufficient documentation

## 2023-11-23 LAB — URINALYSIS, W/ REFLEX TO CULTURE (INFECTION SUSPECTED)
Bilirubin Urine: NEGATIVE
Glucose, UA: NEGATIVE mg/dL
Hgb urine dipstick: NEGATIVE
Ketones, ur: NEGATIVE mg/dL
Leukocytes,Ua: NEGATIVE
Nitrite: NEGATIVE
Protein, ur: NEGATIVE mg/dL
Specific Gravity, Urine: <=1.005 (ref 1.005–1.030)
pH: 6 (ref 5.0–8.0)

## 2023-11-23 LAB — BASIC METABOLIC PANEL
Anion gap: 9 (ref 5–15)
BUN: 18 mg/dL (ref 6–20)
CO2: 27 mmol/L (ref 22–32)
Calcium: 9.9 mg/dL (ref 8.9–10.3)
Chloride: 102 mmol/L (ref 98–111)
Creatinine, Ser: 1.13 mg/dL — ABNORMAL HIGH (ref 0.44–1.00)
GFR, Estimated: 56 mL/min — ABNORMAL LOW (ref >60)
Glucose, Bld: 158 mg/dL — ABNORMAL HIGH (ref 70–99)
Potassium: 3.5 mmol/L (ref 3.5–5.1)
Sodium: 138 mmol/L (ref 135–145)

## 2023-11-23 LAB — TSH: TSH: 2.102 u[IU]/mL (ref 0.350–4.500)

## 2023-11-23 LAB — CBC
HCT: 38.9 % (ref 36.0–46.0)
Hemoglobin: 13.1 g/dL (ref 12.0–15.0)
MCH: 26.7 pg (ref 26.0–34.0)
MCHC: 33.7 g/dL (ref 30.0–36.0)
MCV: 79.2 fL — ABNORMAL LOW (ref 80.0–100.0)
Platelets: 219 10*3/uL (ref 150–400)
RBC: 4.91 MIL/uL (ref 3.87–5.11)
RDW: 13.4 % (ref 11.5–15.5)
WBC: 4.7 10*3/uL (ref 4.0–10.5)
nRBC: 0 % (ref 0.0–0.2)

## 2023-11-23 LAB — TROPONIN I (HIGH SENSITIVITY): Troponin I (High Sensitivity): <2 ng/L (ref <18)

## 2023-11-23 LAB — SARS CORONAVIRUS 2 BY RT PCR: SARS Coronavirus 2 by RT PCR: NEGATIVE

## 2023-11-23 LAB — MAGNESIUM: Magnesium: 2.1 mg/dL (ref 1.7–2.4)

## 2023-11-23 MED ORDER — SODIUM CHLORIDE 0.9 % IV BOLUS
1000.0000 mL | Freq: Once | INTRAVENOUS | Status: AC
  Administered 2023-11-23: 1000 mL via INTRAVENOUS

## 2023-11-23 MED ORDER — DEXAMETHASONE SODIUM PHOSPHATE 10 MG/ML IJ SOLN
10.0000 mg | Freq: Once | INTRAMUSCULAR | Status: AC
  Administered 2023-11-23: 10 mg via INTRAVENOUS
  Filled 2023-11-23: qty 1

## 2023-11-23 MED ORDER — KETOROLAC TROMETHAMINE 30 MG/ML IJ SOLN
15.0000 mg | Freq: Once | INTRAMUSCULAR | Status: AC
  Administered 2023-11-23: 15 mg via INTRAVENOUS
  Filled 2023-11-23: qty 1

## 2023-11-23 MED ORDER — METOCLOPRAMIDE HCL 5 MG/ML IJ SOLN
10.0000 mg | Freq: Once | INTRAMUSCULAR | Status: AC
  Administered 2023-11-23: 10 mg via INTRAVENOUS
  Filled 2023-11-23: qty 2

## 2023-11-23 MED ORDER — DIPHENHYDRAMINE HCL 50 MG/ML IJ SOLN
25.0000 mg | Freq: Once | INTRAMUSCULAR | Status: AC
  Administered 2023-11-23: 25 mg via INTRAVENOUS
  Filled 2023-11-23: qty 1

## 2023-11-23 NOTE — Discharge Instructions (Signed)
Your evaluation today was very reassuring, symptoms could have been due to some mild dehydration, your electrolytes were normal, chest x-ray and other testing was overall reassuring.  Continue to drink plenty of fluids, monitor your symptoms and reach out to your primary care provider for close follow-up.  Return for any new or worsening symptoms.

## 2023-11-23 NOTE — ED Provider Notes (Signed)
EMERGENCY DEPARTMENT AT [facility] Provider Note   CSN: [id number] Arrival date & time: 11/23/23  1406     History  Chief Complaint  Patient presents with   Palpitations    [name] is a 63 y.o. female.  [name] is a 63 y.o. female with a history of hypertension, hyperlipidemia, headaches and GERD, who presents to the emergency department for evaluation of palpitations.  Patient reports that about 3 hours prior to arrival she started to experience a heart pounding sensation.  She ran several EKGs on her smart watch and reports that one of them said that she may have been in A-fib but she did not see anywhere her heart rate was over 100.  She reports that as symptoms started she started to feel a bit weak and lightheaded and briefly felt short of breath.  She denies chest pain associated with these palpitations.  She has not had any syncopal episodes.  She does endorse some anxiety surrounding symptoms.  She took 2 baby aspirin about 30 minutes prior to arrival.  She does report that she was seen at urgent care yesterday for urinary frequency and they performed a urinalysis which did not show convincing signs of infection but given her symptoms she was started on Macrobid.  No other aggravating or alleviating factors.  The history is provided by the patient and medical records.  Palpitations Associated symptoms: no chest pain        Home Medications Prior to Admission medications   Medication Sig Start Date End Date Taking? Authorizing Provider  acetaminophen (TYLENOL) 500 MG tablet Take 1,000 mg by mouth every 6 (six) hours as needed for mild pain or moderate pain.    [provider]  aspirin-acetaminophen-caffeine (EXCEDRIN MIGRAINE) [phone] MG tablet Take 2 tablets by mouth every 6 (six) hours as needed for migraine.    [provider]  cetirizine (ZYRTEC) 10 MG tablet Take 10 mg by mouth daily.    [provider]   diphenhydrAMINE (BENADRYL) 25 mg capsule Take 1 capsule (25 mg total) by mouth every 6 (six) hours as needed for itching. 05/29/18   Derwood Kaplan, MD  Efinaconazole 10 % SOLN Apply 1 drop topically daily. 12/13/22   McDonald, Rachelle Hora, DPM  EPINEPHrine (EPIPEN 2-PAK) 0.3 mg/0.3 mL IJ SOAJ injection Inject 0.3 mLs (0.3 mg total) into the muscle once as needed (for severe allergic reaction). CAll 911 immediately if you have to use this medicine 03/19/16   Pisciotta, Joni Reining, PA-C  fluconazole (DIFLUCAN) 150 MG tablet Take 1 tablet (150 mg total) by mouth once a week. 09/26/22   McDonald, Rachelle Hora, DPM  fluticasone (FLONASE) 50 MCG/ACT nasal spray Place 1 spray into both nostrils daily.    [provider]  Glucosamine-Chondroit-Vit C-Mn (GLUCOSAMINE-CHONDROITIN) TABS Take 1 tablet by mouth daily.    [provider]  loratadine (CLARITIN) 10 MG tablet Take 10 mg by mouth daily.    [provider]  Multiple Vitamin (MULTIVITAMIN) tablet Take 1 tablet by mouth daily.    [provider]  Omega-3 Fatty Acids (FISH OIL PO) Take 1 capsule by mouth daily.    [provider]  omeprazole (PRILOSEC) 40 MG capsule Take 40 mg by mouth daily. 02/12/16   [provider]  predniSONE (DELTASONE) 10 MG tablet Take 5 tablets (50 mg total) by mouth daily. 05/29/18   Derwood Kaplan, MD  ranitidine (ZANTAC) 150 MG tablet Take 150 mg by mouth 2 (  two) times daily as needed for heartburn.     [provider]  VITAMIN E PO Take 1 tablet by mouth daily.    [provider]      Allergies    Imodium [loperamide], Peanut-containing drug products, Penicillins, Codeine, Doxycycline, and Percocet [oxycodone-acetaminophen]    Review of Systems   Review of Systems  Constitutional:  Negative for chills and fever.  Cardiovascular:  Positive for palpitations. Negative for chest pain.  Neurological:  Positive for light-headedness.  All other systems reviewed and are  negative.   Physical Exam Updated Vital Signs BP (!) 156/99   Pulse 88   Temp 97.6 F (36.4 C) (Oral)   Resp 18   Ht 5\' 8"  (1.727 m)   Wt 78 kg   SpO2 100%   BMI 26.15 kg/m  Physical Exam Vitals and nursing note reviewed.  Constitutional:      General: She is not in acute distress.    Appearance: Normal appearance. She is well-developed. She is not diaphoretic.     Comments: Alert and well-appearing, slightly anxious  HENT:     Head: Normocephalic and atraumatic.     Mouth/Throat:     Mouth: Mucous membranes are moist.     Pharynx: Oropharynx is clear.  Eyes:     General:        Right eye: No discharge.        Left eye: No discharge.     Pupils: Pupils are equal, round, and reactive to light.  Cardiovascular:     Rate and Rhythm: Normal rate and regular rhythm.     Pulses: Normal pulses.     Heart sounds: Normal heart sounds.  Pulmonary:     Effort: Pulmonary effort is normal. No respiratory distress.     Breath sounds: Normal breath sounds. No wheezing or rales.     Comments: Respirations equal and unlabored, patient able to speak in full sentences, lungs clear to auscultation bilaterally  Abdominal:     General: Bowel sounds are normal. There is no distension.     Palpations: Abdomen is soft. There is no mass.     Tenderness: There is no abdominal tenderness. There is no guarding.     Comments: Abdomen soft, nondistended, nontender to palpation in all quadrants without guarding or peritoneal signs  Musculoskeletal:        General: No deformity.     Cervical back: Neck supple.  Skin:    General: Skin is warm and dry.     Capillary Refill: Capillary refill takes less than 2 seconds.  Neurological:     Mental Status: She is alert and oriented to person, place, and time.     Coordination: Coordination normal.     Comments: Speech is clear, able to follow commands CN III-XII intact Normal strength in upper and lower extremities bilaterally including dorsiflexion  and plantar flexion, strong and equal grip strength Sensation normal to light and sharp touch Moves extremities without ataxia, coordination intact  Psychiatric:        Mood and Affect: Mood normal.        Behavior: Behavior normal.     ED Results / Procedures / Treatments   Labs (all labs ordered are listed, but only abnormal results are displayed) Labs Reviewed  BASIC METABOLIC PANEL - Abnormal; Notable for the following components:      Result Value   Glucose, Bld 158 (*)    Creatinine, Ser 1.13 (*)    GFR, Estimated  56 (*)    All other components within normal limits  CBC - Abnormal; Notable for the following components:   MCV 79.2 (*)    All other components within normal limits  URINALYSIS, W/ REFLEX TO CULTURE (INFECTION SUSPECTED) - Abnormal; Notable for the following components:   Bacteria, UA RARE (*)    All other components within normal limits  MAGNESIUM  TSH  TROPONIN I (HIGH SENSITIVITY)    EKG EKG Interpretation Date/Time:  Thursday November 23 2023 14:22:44 EST Ventricular Rate:  87 PR Interval:  137 QRS Duration:  90 QT Interval:  385 QTC Calculation: 464 R Axis:   68  Text Interpretation: Sinus rhythm Confirmed by Beckey Downing [phone]) on 11/23/2023 2:42:23 PM  Radiology No results found.  Procedures Procedures    Medications Ordered in ED Medications - No data to display  ED Course/ Medical Decision Making/ A&P                                 Medical Decision Making Amount and/or Complexity of Data Reviewed Labs: ordered. Radiology: ordered.  Risk Prescription drug management.   63 y.o. female presents to the ED with complaints of palpitations, this involves an extensive number of treatment options, and is a complaint that carries with it a high risk of complications and morbidity.  The differential diagnosis includes arrhythmia, electrolyte derangement, dehydration, anxiety, near syncope  On arrival pt is nontoxic, vitals WNL.    Patient reports taking several EKGs on her smart watch 1 of which suggested possible A-fib.  Patient showed these to me and I was able to review them, on old readings heart rate is between 80 and 90 bpm.  There are 2 with a large amount of artifact present where A-fib is read but rate appears regular with P waves present.  Provided patient with reassurance that these do not appear to be A-fib.  Patient remained on cardiac monitor here in the ED and did not have any episodes of A-fib noted.  EKG here in the ED with sinus rhythm  Additional history obtained from chart review. Previous records obtained and reviewed   I ordered medication including IV fluid bolus for possible dehydration related to palpitations and lightheadedness  Lab Tests:  I Ordered, reviewed, and interpreted labs, which included: Midence of anemia, no leukocytosis, mildly elevated creatinine but otherwise no other electrolyte derangements, magnesium is normal, troponin normal, TSH normal, COVID testing negative.  UA without signs of infection  Imaging Studies ordered:  I ordered imaging studies which included chest x-ray, I independently visualized and interpreted imaging which showed no active cardiopulmonary disease  ED Course:   Patient's evaluation today has been very reassuring and cardiac monitoring throughout ED stay has not shown any arrhythmias.  After fluids patient is feeling much better.  Patient provided with reassurance and encouraged to follow-up with her primary care doctor for further evaluation.  Strict return precautions provided.  Discharged home in good condition.    Portions of this note were generated with Scientist, clinical (histocompatibility and immunogenetics). Dictation errors may occur despite best attempts at proofreading.         Final Clinical Impression(s) / ED Diagnoses Final diagnoses:  Palpitations    Rx / DC Orders ED Discharge Orders     None         Velda Shell 11/29/23 2057     Terrilee Files, MD 11/30/23 1045

## 2023-11-23 NOTE — ED Triage Notes (Signed)
Pt reports that her heart has been pounding for the last 3 hours. States that she took 2 baby aspirin about ago. States that she feels week and that at time she can't get her breath. Also feel dizzy.

## 2023-11-23 NOTE — ED Notes (Signed)
Pt started on Macrobid yesterday and has taken 2 doses. Seen at [facility] yesterday and being treated for UTI
